# Patient Record
Sex: Male | Born: 1940
Health system: Southern US, Community
[De-identification: ages and names within clinical notes are randomized; demographics above are authoritative.]

## PROBLEM LIST (undated history)

## (undated) DIAGNOSIS — Q211 Atrial septal defect: Secondary | ICD-10-CM

## (undated) DIAGNOSIS — I341 Nonrheumatic mitral (valve) prolapse: Secondary | ICD-10-CM

## (undated) DIAGNOSIS — I340 Nonrheumatic mitral (valve) insufficiency: Principal | ICD-10-CM

## (undated) DIAGNOSIS — Z8774 Personal history of (corrected) congenital malformations of heart and circulatory system: Secondary | ICD-10-CM

## (undated) DIAGNOSIS — I2699 Other pulmonary embolism without acute cor pulmonale: Secondary | ICD-10-CM

## (undated) HISTORY — DX: Nonrheumatic mitral (valve) insufficiency: I34.0

## (undated) HISTORY — PX: EYE SURGERY: SHX253

## (undated) HISTORY — PX: HERNIA REPAIR: SHX51

## (undated) HISTORY — DX: Nonrheumatic mitral (valve) prolapse: I34.1

## (undated) HISTORY — DX: Atrial septal defect: Q21.1

---

## 2005-04-04 DIAGNOSIS — I2699 Other pulmonary embolism without acute cor pulmonale: Secondary | ICD-10-CM

## 2005-04-04 HISTORY — DX: Other pulmonary embolism without acute cor pulmonale: I26.99

## 2005-08-12 ENCOUNTER — Encounter: Admission: RE | Admit: 2005-08-12 | Discharge: 2005-08-12 | Payer: Self-pay | Admitting: General Surgery

## 2006-02-20 ENCOUNTER — Ambulatory Visit: Payer: Self-pay | Admitting: Oncology

## 2006-02-20 ENCOUNTER — Encounter: Admission: RE | Admit: 2006-02-20 | Discharge: 2006-02-20 | Payer: Self-pay | Admitting: General Surgery

## 2006-02-21 ENCOUNTER — Ambulatory Visit: Payer: Self-pay | Admitting: Oncology

## 2006-02-21 ENCOUNTER — Encounter: Payer: Self-pay | Admitting: Vascular Surgery

## 2006-02-21 ENCOUNTER — Ambulatory Visit: Admission: RE | Admit: 2006-02-21 | Discharge: 2006-02-21 | Payer: Self-pay | Admitting: Oncology

## 2006-02-26 LAB — HYPERCOAGULABLE PANEL, COMPREHENSIVE
Anticardiolipin IgG: <7 [GPL'U] (ref <11)
Anticardiolipin IgM: <7 [MPL'U] (ref <10)
Beta-2 Glyco I IgG: 6 U/mL (ref <20)
Beta-2-Glycoprotein I IgA: <4 U/mL (ref <10)
Beta-2-Glycoprotein I IgM: <4 U/mL (ref <10)
DRVVT 1:1 Mix: 40.3 secs (ref 31.9–44.2)
DRVVT: 47.6 secs — ABNORMAL HIGH (ref 31.9–44.2)
PTTLA Confirmation: 14.4 secs — ABNORMAL HIGH (ref <8.0)
Protein S Activity: 107 % (ref 81–180)
Protein S Ag, Total: 120 % (ref 84–134)

## 2006-02-27 LAB — PROTIME-INR: Protime: 13.2 Seconds (ref 10.6–13.4)

## 2006-03-03 LAB — COMPREHENSIVE METABOLIC PANEL
AST: 35 U/L (ref 0–37)
Alkaline Phosphatase: 88 U/L (ref 39–117)
BUN: 15 mg/dL (ref 6–23)
Chloride: 103 mEq/L (ref 96–112)
Creatinine, Ser: 0.95 mg/dL (ref 0.40–1.50)
Glucose, Bld: 123 mg/dL — ABNORMAL HIGH (ref 70–99)
Potassium: 4 mEq/L (ref 3.5–5.3)
Sodium: 142 mEq/L (ref 135–145)
Total Bilirubin: 0.6 mg/dL (ref 0.3–1.2)

## 2006-03-03 LAB — PROTIME-INR: Protime: 22.8 Seconds — ABNORMAL HIGH (ref 10.6–13.4)

## 2006-03-07 LAB — PROTIME-INR
INR: 2.6 (ref 2.00–3.50)
Protime: 31.2 Seconds — ABNORMAL HIGH (ref 10.6–13.4)

## 2006-03-10 LAB — PROTIME-INR: Protime: 32.4 Seconds — ABNORMAL HIGH (ref 10.6–13.4)

## 2006-03-24 LAB — PROTIME-INR: INR: 3.4 (ref 2.00–3.50)

## 2006-03-27 LAB — PROTIME-INR
INR: 2.4 (ref 2.00–3.50)
Protime: 28.8 Seconds — ABNORMAL HIGH (ref 10.6–13.4)

## 2006-03-31 LAB — PROTIME-INR
INR: 3.7 — ABNORMAL HIGH (ref 2.00–3.50)
Protime: 44.4 Seconds — ABNORMAL HIGH (ref 10.6–13.4)

## 2006-04-03 LAB — CBC WITH DIFFERENTIAL/PLATELET
BASO%: 1.6 % (ref 0.0–2.0)
Basophils Absolute: 0.1 10*3/uL (ref 0.0–0.1)
Eosinophils Absolute: 0.4 10*3/uL (ref 0.0–0.5)
HGB: 16.2 g/dL (ref 13.0–17.1)
MCHC: 35.6 g/dL (ref 32.0–35.9)
MONO#: 0.5 10*3/uL (ref 0.1–0.9)
MONO%: 9.4 % (ref 0.0–13.0)
NEUT#: 2.9 10*3/uL (ref 1.5–6.5)
NEUT%: 50 % (ref 40.0–75.0)
RBC: 5.64 10*6/uL (ref 4.20–5.71)
RDW: 11.6 % (ref 11.2–14.6)
lymph#: 1.8 10*3/uL (ref 0.9–3.3)

## 2006-04-11 ENCOUNTER — Ambulatory Visit: Payer: Self-pay | Admitting: Oncology

## 2006-04-14 LAB — PROTIME-INR
INR: 3 (ref 2.00–3.50)
Protime: 36 Seconds — ABNORMAL HIGH (ref 10.6–13.4)

## 2006-04-24 ENCOUNTER — Ambulatory Visit: Payer: Self-pay | Admitting: Oncology

## 2006-05-12 LAB — PROTIME-INR
INR: 2.3 (ref 2.00–3.50)
Protime: 27.6 Seconds — ABNORMAL HIGH (ref 10.6–13.4)

## 2006-05-31 ENCOUNTER — Encounter: Admission: RE | Admit: 2006-05-31 | Discharge: 2006-05-31 | Payer: Self-pay | Admitting: Oncology

## 2006-06-02 LAB — CBC WITH DIFFERENTIAL/PLATELET
Basophils Absolute: 0.1 10*3/uL (ref 0.0–0.1)
EOS%: 4.4 % (ref 0.0–7.0)
MCH: 29 pg (ref 28.0–33.4)
MCHC: 35.4 g/dL (ref 32.0–35.9)
MCV: 81.9 fL (ref 81.6–98.0)
MONO#: 0.4 10*3/uL (ref 0.1–0.9)
NEUT#: 3.8 10*3/uL (ref 1.5–6.5)
RDW: 13.3 % (ref 11.2–14.6)
WBC: 6.2 10*3/uL (ref 4.0–10.0)
lymph#: 1.6 10*3/uL (ref 0.9–3.3)

## 2006-06-02 LAB — CHCC SMEAR

## 2006-06-02 LAB — FERRITIN: Ferritin: 195 ng/mL (ref 22–322)

## 2006-06-02 LAB — PROTIME-INR: INR: 3.4 (ref 2.00–3.50)

## 2006-06-20 ENCOUNTER — Ambulatory Visit: Payer: Self-pay | Admitting: Oncology

## 2006-06-23 LAB — PROTIME-INR
INR: 2.5 (ref 2.00–3.50)
Protime: 30 s — ABNORMAL HIGH (ref 10.6–13.4)

## 2006-07-28 LAB — PROTIME-INR: Protime: 31.2 Seconds — ABNORMAL HIGH (ref 10.6–13.4)

## 2006-08-25 ENCOUNTER — Ambulatory Visit: Payer: Self-pay | Admitting: Oncology

## 2006-08-29 LAB — PROTIME-INR
INR: 2.7 (ref 2.00–3.50)
Protime: 32.4 Seconds — ABNORMAL HIGH (ref 10.6–13.4)

## 2006-11-08 ENCOUNTER — Ambulatory Visit: Payer: Self-pay | Admitting: Oncology

## 2006-11-08 LAB — PROTIME-INR: Protime: 39.6 Seconds — ABNORMAL HIGH (ref 10.6–13.4)

## 2006-12-27 ENCOUNTER — Ambulatory Visit: Payer: Self-pay | Admitting: Oncology

## 2006-12-27 LAB — PROTIME-INR: Protime: 33.6 Seconds — ABNORMAL HIGH (ref 10.6–13.4)

## 2007-06-08 ENCOUNTER — Ambulatory Visit: Payer: Self-pay | Admitting: Oncology

## 2007-12-05 ENCOUNTER — Ambulatory Visit: Payer: Self-pay | Admitting: Oncology

## 2007-12-06 LAB — PROTIME-INR
INR: 2.6 (ref 2.00–3.50)
Protime: 31.2 Seconds — ABNORMAL HIGH (ref 10.6–13.4)

## 2013-06-05 ENCOUNTER — Inpatient Hospital Stay (HOSPITAL_COMMUNITY)
Admission: EM | Admit: 2013-06-05 | Discharge: 2013-06-07 | DRG: 444 | Disposition: A | Discharge status: Home / Self Care | Payer: Medicare Other | Attending: General Surgery | Admitting: General Surgery

## 2013-06-05 ENCOUNTER — Emergency Department (HOSPITAL_COMMUNITY): Payer: Medicare Other

## 2013-06-05 ENCOUNTER — Encounter (HOSPITAL_COMMUNITY): Payer: Self-pay | Admitting: Emergency Medicine

## 2013-06-05 DIAGNOSIS — K8050 Calculus of bile duct without cholangitis or cholecystitis without obstruction: Principal | ICD-10-CM | POA: Diagnosis present

## 2013-06-05 DIAGNOSIS — Z791 Long term (current) use of non-steroidal anti-inflammatories (NSAID): Secondary | ICD-10-CM

## 2013-06-05 DIAGNOSIS — K859 Acute pancreatitis without necrosis or infection, unspecified: Secondary | ICD-10-CM | POA: Diagnosis present

## 2013-06-05 DIAGNOSIS — Z86711 Personal history of pulmonary embolism: Secondary | ICD-10-CM

## 2013-06-05 HISTORY — DX: Other pulmonary embolism without acute cor pulmonale: I26.99

## 2013-06-05 LAB — CBC WITH DIFFERENTIAL/PLATELET
BASOS ABS: 0 10*3/uL (ref 0.0–0.1)
Basophils Relative: 0 % (ref 0–1)
Eosinophils Absolute: 0.1 10*3/uL (ref 0.0–0.7)
Eosinophils Relative: 1 % (ref 0–5)
HCT: 41.7 % (ref 39.0–52.0)
Hemoglobin: 15.2 g/dL (ref 13.0–17.0)
LYMPHS PCT: 2 % — AB (ref 12–46)
Lymphs Abs: 0.3 10*3/uL — ABNORMAL LOW (ref 0.7–4.0)
MCH: 30.2 pg (ref 26.0–34.0)
MCHC: 36.5 g/dL — ABNORMAL HIGH (ref 30.0–36.0)
MCV: 82.9 fL (ref 78.0–100.0)
Monocytes Absolute: 1.5 10*3/uL — ABNORMAL HIGH (ref 0.1–1.0)
Monocytes Relative: 11 % (ref 3–12)
NEUTROS ABS: 11.3 10*3/uL — AB (ref 1.7–7.7)
Neutrophils Relative %: 86 % — ABNORMAL HIGH (ref 43–77)
PLATELETS: 167 10*3/uL (ref 150–400)
RBC: 5.03 MIL/uL (ref 4.22–5.81)
RDW: 13.5 % (ref 11.5–15.5)
WBC: 13.1 10*3/uL — AB (ref 4.0–10.5)

## 2013-06-05 LAB — COMPREHENSIVE METABOLIC PANEL
ALT: 132 U/L — ABNORMAL HIGH (ref 0–53)
AST: 112 U/L — ABNORMAL HIGH (ref 0–37)
Albumin: 3.7 g/dL (ref 3.5–5.2)
Alkaline Phosphatase: 233 U/L — ABNORMAL HIGH (ref 39–117)
BUN: 19 mg/dL (ref 6–23)
CO2: 20 mEq/L (ref 19–32)
Calcium: 10 mg/dL (ref 8.4–10.5)
Chloride: 99 mEq/L (ref 96–112)
Creatinine, Ser: 0.74 mg/dL (ref 0.50–1.35)
GFR calc Af Amer: >90 mL/min (ref >90)
GFR calc non Af Amer: >90 mL/min (ref >90)
Glucose, Bld: 159 mg/dL — ABNORMAL HIGH (ref 70–99)
Potassium: 3.5 mEq/L — ABNORMAL LOW (ref 3.7–5.3)
Sodium: 138 mEq/L (ref 137–147)
Total Bilirubin: 8.7 mg/dL — ABNORMAL HIGH (ref 0.3–1.2)
Total Protein: 7.7 g/dL (ref 6.0–8.3)

## 2013-06-05 LAB — URINE MICROSCOPIC-ADD ON

## 2013-06-05 LAB — LIPASE, BLOOD: Lipase: 2003 U/L — ABNORMAL HIGH (ref 11–59)

## 2013-06-05 LAB — URINALYSIS, ROUTINE W REFLEX MICROSCOPIC
Glucose, UA: NEGATIVE mg/dL
Ketones, ur: 15 mg/dL — AB
Nitrite: POSITIVE — AB
Protein, ur: 100 mg/dL — AB
Specific Gravity, Urine: 1.03 (ref 1.005–1.030)
Urobilinogen, UA: >8 mg/dL — ABNORMAL HIGH (ref 0.0–1.0)
pH: 5 (ref 5.0–8.0)

## 2013-06-05 LAB — I-STAT TROPONIN, ED: Troponin i, poc: 0.06 ng/mL (ref 0.00–0.08)

## 2013-06-05 MED ORDER — PANTOPRAZOLE SODIUM 40 MG IV SOLR
40.0000 mg | Freq: Every day | INTRAVENOUS | Status: DC
  Administered 2013-06-05: 40 mg via INTRAVENOUS
  Filled 2013-06-05 (×2): qty 40

## 2013-06-05 MED ORDER — ONDANSETRON HCL 4 MG/2ML IJ SOLN
4.0000 mg | Freq: Once | INTRAMUSCULAR | Status: AC
  Administered 2013-06-05: 4 mg via INTRAVENOUS
  Filled 2013-06-05: qty 2

## 2013-06-05 MED ORDER — SODIUM CHLORIDE 0.9 % IV SOLN
1000.0000 mL | Freq: Once | INTRAVENOUS | Status: AC
Start: 2013-06-05 — End: 2013-06-05
  Administered 2013-06-05: 1000 mL via INTRAVENOUS

## 2013-06-05 MED ORDER — HEPARIN SODIUM (PORCINE) 5000 UNIT/ML IJ SOLN
5000.0000 [IU] | Freq: Three times a day (TID) | INTRAMUSCULAR | Status: DC
  Administered 2013-06-05 – 2013-06-07 (×5): 5000 [IU] via SUBCUTANEOUS
  Filled 2013-06-05 (×8): qty 1

## 2013-06-05 MED ORDER — ONDANSETRON HCL 4 MG/2ML IJ SOLN: 4.0000 mg | Freq: Four times a day (QID) | INTRAMUSCULAR | Status: DC | PRN

## 2013-06-05 MED ORDER — MORPHINE SULFATE 2 MG/ML IJ SOLN: 2.0000 mg | INTRAMUSCULAR | Status: DC | PRN

## 2013-06-05 MED ORDER — SODIUM CHLORIDE 0.9 % IV SOLN
1000.0000 mL | INTRAVENOUS | Status: DC
  Administered 2013-06-05: 1000 mL via INTRAVENOUS

## 2013-06-05 MED ORDER — KCL IN DEXTROSE-NACL 20-5-0.9 MEQ/L-%-% IV SOLN
INTRAVENOUS | Status: DC
  Administered 2013-06-05 – 2013-06-07 (×4): via INTRAVENOUS
  Filled 2013-06-05 (×6): qty 1000

## 2013-06-05 MED ORDER — IOHEXOL 300 MG/ML  SOLN
20.0000 mL | Freq: Once | INTRAMUSCULAR | Status: AC | PRN
  Administered 2013-06-05: 20 mL via ORAL

## 2013-06-05 MED ORDER — IOHEXOL 300 MG/ML  SOLN
100.0000 mL | Freq: Once | INTRAMUSCULAR | Status: AC | PRN
  Administered 2013-06-05: 100 mL via INTRAVENOUS

## 2013-06-05 NOTE — ED Notes (Signed)
Pt states he has been having epigastric pain and bloating feeling for 3 days. Also reports vomiting since last night only a couple times. Denies any lower abd pain or upper chest pain.

## 2013-06-05 NOTE — ED Provider Notes (Signed)
CSN: [id number]     Arrival date & time 06/05/13  1703 History   First MD Initiated Contact with Patient 06/05/13 1745     Chief Complaint  Patient presents with  . Abdominal Pain  . Emesis      HPI Is reports upper abdominal discomfort as well as some associated nausea vomiting over the past several days.  He denies fevers and chills.  He denies back pain or flank pain.  He has no dysuria or urinary frequency.  He reports his urine has been concentrated in color.  His had decreased oral intake.  He has no prior history of abdominal surgery.  No prior history of gallstones.  He's never had discomfort or pain like this before.  No chest pain or shortness of breath.  His pain is mild to moderate in severity at this time.  He states his pain next he feels to be improved.  His main complaint at this time is nausea  Past Medical History  Diagnosis Date  . Pulmonary embolism    Past Surgical History  Procedure Laterality Date  . Hernia repair     No family history on file. History  Substance Use Topics  . Smoking status: Current Every Day Smoker  . Smokeless tobacco: Not on file  . Alcohol Use: Yes     Comment: occasionally    Review of Systems  All other systems reviewed and are negative.      Allergies  Primaxin  Home Medications   Current Outpatient Rx  Name  Route  Sig  Dispense  Refill  . ibuprofen (ADVIL,MOTRIN) 200 MG tablet   Oral   Take 400 mg by mouth every 6 (six) hours as needed for moderate pain.          BP 110/62  Pulse 78  Temp(Src) 99.6 F (37.6 C) (Oral)  Resp 16  Ht 5\' 9"  (1.753 m)  Wt 175 lb (79.379 kg)  BMI 25.83 kg/m2  SpO2 94% Physical Exam  Nursing note and vitals reviewed. Constitutional: He is oriented to person, place, and time. He appears well-developed and well-nourished.  HENT:  Head: Normocephalic and atraumatic.  Eyes: EOM are normal.  Neck: Normal range of motion.  Cardiovascular: Normal rate, regular rhythm, normal heart  sounds and intact distal pulses.   Pulmonary/Chest: Effort normal and breath sounds normal. No respiratory distress.  Abdominal: Soft. He exhibits no distension. There is no tenderness.  Musculoskeletal: Normal range of motion.  Neurological: He is alert and oriented to person, place, and time.  Skin: Skin is warm and dry.  Psychiatric: He has a normal mood and affect. Judgment normal.    ED Course  Procedures (including critical care time) Labs Review Labs Reviewed  CBC WITH DIFFERENTIAL - Abnormal; Notable for the following:    WBC 13.1 (*)    MCHC 36.5 (*)    Neutrophils Relative % 86 (*)    Neutro Abs 11.3 (*)    Lymphocytes Relative 2 (*)    Lymphs Abs 0.3 (*)    Monocytes Absolute 1.5 (*)    All other components within normal limits  COMPREHENSIVE METABOLIC PANEL - Abnormal; Notable for the following:    Potassium 3.5 (*)    Glucose, Bld 159 (*)    AST 112 (*)    ALT 132 (*)    Alkaline Phosphatase 233 (*)    Total Bilirubin 8.7 (*)    All other components within normal limits  LIPASE, BLOOD - Abnormal; Notable  for the following:    Lipase 2003 (*)    All other components within normal limits  URINALYSIS, ROUTINE W REFLEX MICROSCOPIC - Abnormal; Notable for the following:    Color, Urine ORANGE (*)    APPearance HAZY (*)    Hgb urine dipstick TRACE (*)    Bilirubin Urine LARGE (*)    Ketones, ur 15 (*)    Protein, ur 100 (*)    Urobilinogen, UA >8.0 (*)    Nitrite POSITIVE (*)    Leukocytes, UA SMALL (*)    All other components within normal limits  URINE MICROSCOPIC-ADD ON - Abnormal; Notable for the following:    Squamous Epithelial / LPF FEW (*)    Bacteria, UA FEW (*)    Casts GRANULAR CAST (*)    All other components within normal limits  I-STAT TROPOININ, ED   Imaging Review Ct Abdomen Pelvis W Contrast  06/05/2013   CLINICAL DATA:  Epigastric pain with bloating for 3 days. Vomiting.  EXAM: CT ABDOMEN AND PELVIS WITH CONTRAST  TECHNIQUE: Multidetector  CT imaging of the abdomen and pelvis was performed using the standard protocol following bolus administration of intravenous contrast.  CONTRAST:  100mL OMNIPAQUE IOHEXOL 300 MG/ML  SOLN  COMPARISON:  None.  FINDINGS: Lung Bases: Motion artifact present at the lung bases. Dependent atelectasis.  Liver: 14 mm intermediate density lesion present in segment 4A of the liver, nonspecific. Small amount of intrahepatic biliary ductal dilation. Tiny amount perihepatic ascites.  Spleen:  Normal.  Gallbladder:  Distended.  No calcified stones identified.  Common bile duct: Dilated to 14 mm. No calcified stones identified in the common bile duct.  Pancreas: Peripancreatic phlegmon is present suggesting acute pancreatitis. No fluid collections are identified. The uncinate process of pancreas appears edematous.  Adrenal glands:  Normal.  Kidneys: Normal enhancement. Left inferior pole simple renal cyst measuring 28 mm. Normal delayed excretion of contrast. The ureters appear within normal limits.  Stomach:  Small hiatal hernia.  Small bowel: Duodenum appears normal. No bowel obstruction or bowel inflammatory changes.  Colon: Normal appendix. Distal colon decompressed. No colonic inflammatory changes.  Pelvic Genitourinary: Urinary bladder appears normal. Mild prostatic enlargement with 48 mm transverse band of the prostate gland. No free fluid.  Bones: Severe lumbar spondylosis. No aggressive osseous lesions. Likely degenerative lumbar scoliosis. Sclerotic lesion in the parasymphyseal right pubis likely represents a benign bone island. Consider correlation with PSA in this old male.  Vasculature: No acute abnormality.  Atherosclerosis.  Body Wall: Fat containing right inguinal hernia. Fluid is present in the hernia sac.  IMPRESSION: 1. Peripancreatic phlegmon, peripancreatic phlegmon and swelling and edema of the uncinate process of the pancreas. Dilated common bile duct. Findings suggest gallstone pancreatitis. Correlation with  amylase and lipase recommended. 2. Left inferior pole simple renal cyst. 3. Indeterminate intermediate attenuation 14 mm lesion in segment 4A of the liver. 4. Small sclerotic lesion in the right parasymphyseal pubis. Consider correlation with PSA.   Electronically Signed   By: Andreas NewportGeoffrey  Lamke M.D.   On: 06/05/2013 19:53  I personally reviewed the imaging tests through PACS system I reviewed available ER/hospitalization records through the EMR    EKG Interpretation   Date/Time:  Wednesday June 05 2013 17:26:01 EST Ventricular Rate:  105 PR Interval:  164 QRS Duration: 82 QT Interval:  322 QTC Calculation: 425 R Axis:   67 Text Interpretation:  Sinus tachycardia with frequent Premature  ventricular complexes Nonspecific ST and T wave abnormality Abnormal ECG  No old tracing to compare Confirmed by Kattleya Kuhnert  MD, Caryn Bee ([id number]) on  06/05/2013 6:51:25 PM      MDM   Final diagnoses:  Pancreatitis    Likely gallstone pancreatitis.  The patient is feeling much better at this time.  No peritonitis on exam.  Patient will be admitted to the general surgery service.  Likely ultrasound morning.  Surgery will follow and trend his labs.  Transient hypotension emergency department. Responded to fluids,.  Likely related to volume depletion  CCS: Dr Michaela Corner, MD 06/05/13 2026

## 2013-06-05 NOTE — H&P (Signed)
Jeremy Fisher is an 73 y.o. male.   Chief Complaint: abdominal pain, vomiting  HPI: Patient was in his usual state of good health until 3 days ago when after dinner he developed fairly rapid onset of epigastric abdominal pain. He describes aching and cramping pain that came in waves, waxing and waning. The pain continued and the following day, 2 days ago he developed nausea and vomiting. Vomiting has been nonbilious. The symptoms continued to into today with continued epigastric abdominal pain and vomiting. He has noticed very dark urine. He called and I asked him to come to the emergency room for evaluation. A couple of months ago he had an episode of mild abdominal pain and nausea that resolved after a day or 2. He does not drink alcohol. Otherwise he has no chronic or ongoing GI complaints. He had normal bowel movements the last 2 days. No melena or hematochezia. He has felt chills but no fever.  Past Medical History  Diagnosis Date  . Pulmonary embolism     Past Surgical History  Procedure Laterality Date  . Hernia repair      No family history on file. Social History:  Rare alcohol and none recently. Does not smoke  Allergies:  Allergies  Allergen Reactions  . Primaxin [Imipenem] Other (See Comments)    seizure   Current Facility-Administered Medications  Medication Dose Route Frequency Provider Last Rate Last Dose  . 0.9 %  sodium chloride infusion  1,000 mL Intravenous Continuous Hoy Morn, MD 125 mL/hr at 06/05/13 1900 1,000 mL at 06/05/13 1900   Current Outpatient Prescriptions  Medication Sig Dispense Refill  . ibuprofen (ADVIL,MOTRIN) 200 MG tablet Take 400 mg by mouth every 6 (six) hours as needed for moderate pain.         Results for orders placed during the hospital encounter of 06/05/13 (from the past 48 hour(s))  CBC WITH DIFFERENTIAL     Status: Abnormal   Collection Time    06/05/13  5:26 PM      Result Value Ref Range   WBC 13.1 (*) 4.0 - 10.5  K/uL   RBC 5.03  4.22 - 5.81 MIL/uL   Hemoglobin 15.2  13.0 - 17.0 g/dL   HCT 41.7  39.0 - 52.0 %   MCV 82.9  78.0 - 100.0 fL   MCH 30.2  26.0 - 34.0 pg   MCHC 36.5 (*) 30.0 - 36.0 g/dL   RDW 13.5  11.5 - 15.5 %   Platelets 167  150 - 400 K/uL   Neutrophils Relative % 86 (*) 43 - 77 %   Neutro Abs 11.3 (*) 1.7 - 7.7 K/uL   Lymphocytes Relative 2 (*) 12 - 46 %   Lymphs Abs 0.3 (*) 0.7 - 4.0 K/uL   Monocytes Relative 11  3 - 12 %   Monocytes Absolute 1.5 (*) 0.1 - 1.0 K/uL   Eosinophils Relative 1  0 - 5 %   Eosinophils Absolute 0.1  0.0 - 0.7 K/uL   Basophils Relative 0  0 - 1 %   Basophils Absolute 0.0  0.0 - 0.1 K/uL  COMPREHENSIVE METABOLIC PANEL     Status: Abnormal   Collection Time    06/05/13  5:26 PM      Result Value Ref Range   Sodium 138  137 - 147 mEq/L   Potassium 3.5 (*) 3.7 - 5.3 mEq/L   Chloride 99  96 - 112 mEq/L   CO2 20  19 -  32 mEq/L   Glucose, Bld 159 (*) 70 - 99 mg/dL   BUN 19  6 - 23 mg/dL   Creatinine, Ser 0.74  0.50 - 1.35 mg/dL   Calcium 10.0  8.4 - 10.5 mg/dL   Total Protein 7.7  6.0 - 8.3 g/dL   Albumin 3.7  3.5 - 5.2 g/dL   AST 112 (*) 0 - 37 U/L   ALT 132 (*) 0 - 53 U/L   Alkaline Phosphatase 233 (*) 39 - 117 U/L   Total Bilirubin 8.7 (*) 0.3 - 1.2 mg/dL   GFR calc non Af Amer >90  >90 mL/min   GFR calc Af Amer >90  >90 mL/min   Comment: (NOTE)     The eGFR has been calculated using the CKD EPI equation.     This calculation has not been validated in all clinical situations.     eGFR's persistently <90 mL/min signify possible Chronic Kidney     Disease.  LIPASE, BLOOD     Status: Abnormal   Collection Time    06/05/13  5:26 PM      Result Value Ref Range   Lipase 2003 (*) 11 - 59 U/L  I-STAT TROPOININ, ED     Status: None   Collection Time    06/05/13  6:12 PM      Result Value Ref Range   Troponin i, poc 0.06  0.00 - 0.08 ng/mL   Comment 3            Comment: Due to the release kinetics of cTnI,     a negative result within the  first hours     of the onset of symptoms does not rule out     myocardial infarction with certainty.     If myocardial infarction is still suspected,     repeat the test at appropriate intervals.   No results found.  Review of Systems  Constitutional: Positive for chills and malaise/fatigue. Negative for fever.  HENT: Negative.   Respiratory: Negative.   Cardiovascular: Negative.   Gastrointestinal: Positive for nausea, vomiting and abdominal pain. Negative for diarrhea, constipation, blood in stool and melena.  Genitourinary: Positive for hematuria and flank pain.       Dark urine  Musculoskeletal: Negative.     Blood pressure 106/38, pulse 97, temperature 99.6 F (37.6 C), temperature source Oral, resp. rate 16, height '5\' 9"'  (1.753 m), weight 175 lb (79.379 kg), SpO2 94.00%. Physical Exam  General: Alert, well-developed Caucasian male, in no distress Skin: Warm and dry without rash or infection. Probable jaundice. HEENT:  Sclera appear icteric. Pupils equal round and reactive.  Lungs: Breath sounds clear and equal without increased work of breathing Cardiovascular:mild tachycardia without murmur. No JVD or edema.  Abdomen: Nondistended.bowel sounds are present. There is mild epigastric tenderness without guarding or palpable mass. No organomegaly. There is a very tiny nontender reducible umbilical hernia. Extremities: No edema or joint swelling or deformity. No chronic venous stasis changes. Neurologic: Alert and fully oriented.   Assessment/Plan Acute abdominal pain and vomiting with lab work showing acute pancreatitis and elevated LFTs. He is a non-drinker and most likely this represents gallstone pancreatitis. We will proceed with CT scan of the abdomen and pelvis and ultrasound of the gallbladder and he will be admitted for treatment with IV fluids, pain medication and antiemetics.  Sarah Zerby T 06/05/2013, 7:17 PM

## 2013-06-06 ENCOUNTER — Encounter (HOSPITAL_COMMUNITY): Payer: Self-pay | Admitting: General Practice

## 2013-06-06 ENCOUNTER — Inpatient Hospital Stay (HOSPITAL_COMMUNITY): Payer: Medicare Other

## 2013-06-06 LAB — COMPREHENSIVE METABOLIC PANEL
ALBUMIN: 2.9 g/dL — AB (ref 3.5–5.2)
ALT: 87 U/L — AB (ref 0–53)
AST: 68 U/L — ABNORMAL HIGH (ref 0–37)
Alkaline Phosphatase: 178 U/L — ABNORMAL HIGH (ref 39–117)
BUN: 15 mg/dL (ref 6–23)
CO2: 18 mEq/L — ABNORMAL LOW (ref 19–32)
Calcium: 9 mg/dL (ref 8.4–10.5)
Chloride: 105 mEq/L (ref 96–112)
Creatinine, Ser: 0.7 mg/dL (ref 0.50–1.35)
GFR calc non Af Amer: >90 mL/min (ref >90)
Glucose, Bld: 133 mg/dL — ABNORMAL HIGH (ref 70–99)
POTASSIUM: 4.3 meq/L (ref 3.7–5.3)
SODIUM: 138 meq/L (ref 137–147)
TOTAL PROTEIN: 6.3 g/dL (ref 6.0–8.3)
Total Bilirubin: 6.6 mg/dL — ABNORMAL HIGH (ref 0.3–1.2)

## 2013-06-06 LAB — CBC
HEMATOCRIT: 36.7 % — AB (ref 39.0–52.0)
Hemoglobin: 12.7 g/dL — ABNORMAL LOW (ref 13.0–17.0)
MCH: 29.2 pg (ref 26.0–34.0)
MCHC: 34.6 g/dL (ref 30.0–36.0)
MCV: 84.4 fL (ref 78.0–100.0)
Platelets: 145 10*3/uL — ABNORMAL LOW (ref 150–400)
RBC: 4.35 MIL/uL (ref 4.22–5.81)
RDW: 13.6 % (ref 11.5–15.5)
WBC: 9.3 10*3/uL (ref 4.0–10.5)

## 2013-06-06 LAB — LIPASE, BLOOD: Lipase: 1573 U/L — ABNORMAL HIGH (ref 11–59)

## 2013-06-06 MED ORDER — PANTOPRAZOLE SODIUM 40 MG PO TBEC
40.0000 mg | DELAYED_RELEASE_TABLET | Freq: Every day | ORAL | Status: DC
  Administered 2013-06-06: 40 mg via ORAL

## 2013-06-06 MED ORDER — HYDROCODONE-ACETAMINOPHEN 5-325 MG PO TABS: 1.0000 | ORAL_TABLET | ORAL | Status: DC | PRN

## 2013-06-06 NOTE — Progress Notes (Signed)
Patient ID: Jeremy Fisher, male   DOB: 1940/05/14, 73 y.o.   MRN: [id number]    Subjective: Feels much better today. Pain is resolved. No nausea.  Objective: Vital signs in last 24 hours: Temp:  [98 F (36.7 C)-99.6 F (37.6 C)] 98 F (36.7 C) (03/05 0558) Pulse Rate:  [66-108] 66 (03/05 0558) Resp:  [16-20] 20 (03/05 0558) BP: (101-148)/(38-76) 108/54 mmHg (03/05 0558) SpO2:  [94 %-96 %] 94 % (03/05 0558) Weight:  [170 lb 3.1 oz (77.2 kg)-175 lb (79.379 kg)] 170 lb 3.1 oz (77.2 kg) (03/04 2036) Last BM Date: 06/05/13  Intake/Output from previous day:   Intake/Output this shift:    General appearance: alert, cooperative and no distress GI: normal findings: soft, non-tender and nondistended  Lab Results:   Recent Labs  06/05/13 1726 06/06/13 0553  WBC 13.1* 9.3  HGB 15.2 12.7*  HCT 41.7 36.7*  PLT 167 145*   BMET  Recent Labs  06/05/13 1726 06/06/13 0553  NA 138 138  K 3.5* 4.3  CL 99 105  CO2 20 18*  GLUCOSE 159* 133*  BUN 19 15  CREATININE 0.74 0.70  CALCIUM 10.0 9.0     Studies/Results: US Abdomen Complete  06/06/2013   CLINICAL DATA:  Evaluate for gallstones, acute pancreatitis  EXAM: ULTRASOUND ABDOMEN COMPLETE  COMPARISON:  None.  FINDINGS: Gallbladder:  No gallstones, or sludge. The gallbladder wall is thickened and demonstrates areas of edema measuring 4.7 mm. No sonographic Murphy sign noted.  Common bile duct:  Diameter: 5.4 mm at the head of the pancreas  Liver:  No focal lesion identified. Within normal limits in parenchymal echogenicity.  IVC:  No abnormality visualized.  Pancreas:  Visualized portion unremarkable.  Spleen:  Size and appearance within normal limits.  Right Kidney:  Length: 10.9 cm. Echogenicity within normal limits. No mass or hydronephrosis visualized.  Left Kidney:  Length: 12.4 cm. Echogenicity within normal limits. No mass or hydronephrosis visualized.  Abdominal aorta:  No aneurysm visualized.  Other findings:  None.   IMPRESSION: There is gallbladder wall thickening and edema likely reactive considering patient's history of pancreatitis. Otherwise no evidence of cholecystitis. There is no evidence of gallstones nor sonographically appreciable sludge within the gallbladder.   Electronically Signed   By: Salome Holmes M.D.   On: 06/06/2013 08:47   Ct Abdomen Pelvis W Contrast  06/05/2013   CLINICAL DATA:  Epigastric pain with bloating for 3 days. Vomiting.  EXAM: CT ABDOMEN AND PELVIS WITH CONTRAST  TECHNIQUE: Multidetector CT imaging of the abdomen and pelvis was performed using the standard protocol following bolus administration of intravenous contrast.  CONTRAST:  OMNIPAQUE IOHEXOL 300 MG/ML  SOLN  COMPARISON:  None.  FINDINGS: Lung Bases: Motion artifact present at the lung bases. Dependent atelectasis.  Liver: 14 mm intermediate density lesion present in segment 4A of the liver, nonspecific. Small amount of intrahepatic biliary ductal dilation. Tiny amount perihepatic ascites.  Spleen:  Normal.  Gallbladder:  Distended.  No calcified stones identified.  Common bile duct: Dilated to 14 mm. No calcified stones identified in the common bile duct.  Pancreas: Peripancreatic phlegmon is present suggesting acute pancreatitis. No fluid collections are identified. The uncinate process of pancreas appears edematous.  Adrenal glands:  Normal.  Kidneys: Normal enhancement. Left inferior pole simple renal cyst measuring 28 mm. Normal delayed excretion of contrast. The ureters appear within normal limits.  Stomach:  Small hiatal hernia.  Small bowel: Duodenum appears normal. No bowel obstruction or  bowel inflammatory changes.  Colon: Normal appendix. Distal colon decompressed. No colonic inflammatory changes.  Pelvic Genitourinary: Urinary bladder appears normal. Mild prostatic enlargement with 48 mm transverse band of the prostate gland. No free fluid.  Bones: Severe lumbar spondylosis. No aggressive osseous lesions. Likely  degenerative lumbar scoliosis. Sclerotic lesion in the parasymphyseal right pubis likely represents a benign bone island. Consider correlation with PSA in this old male.  Vasculature: No acute abnormality.  Atherosclerosis.  Body Wall: Fat containing right inguinal hernia. Fluid is present in the hernia sac.  IMPRESSION: 1. Peripancreatic phlegmon, peripancreatic phlegmon and swelling and edema of the uncinate process of the pancreas. Dilated common bile duct. Findings suggest gallstone pancreatitis. Correlation with amylase and lipase recommended. 2. Left inferior pole simple renal cyst. 3. Indeterminate intermediate attenuation 14 mm lesion in segment 4A of the liver. 4. Small sclerotic lesion in the right parasymphyseal pubis. Consider correlation with PSA.   Electronically Signed   By: Andreas NewportGeoffrey  Lamke M.D.   On: 06/05/2013 19:53    Anti-infectives: Anti-infectives   None      Assessment/Plan: Acute pancreatitis and elevated bilirubin and LFTs, clinically all improving. Ultrasound does not show gallstones but this is his second acute episode of similar pain which appears to be resolving quickly. The common bile duct was dilated on CT scan but is not today on ultrasound. This all seems extremely suspicious for a passed common bile duct stone despite the negative ultrasound. He has no other reason for acute pancreatitis and nothing clinically suggests neoplasm. We'll observe today and start clear liquid diet. Repeat lab tomorrow. If this resolves I believe the best course would be cholecystectomy with cholangiogram.    LOS: 1 day    [name] T 06/06/2013

## 2013-06-07 ENCOUNTER — Other Ambulatory Visit (INDEPENDENT_AMBULATORY_CARE_PROVIDER_SITE_OTHER): Payer: Self-pay | Admitting: General Surgery

## 2013-06-07 LAB — CBC
HCT: 36.8 % — ABNORMAL LOW (ref 39.0–52.0)
HEMOGLOBIN: 12.8 g/dL — AB (ref 13.0–17.0)
MCH: 29.6 pg (ref 26.0–34.0)
MCHC: 34.8 g/dL (ref 30.0–36.0)
MCV: 85 fL (ref 78.0–100.0)
Platelets: 158 10*3/uL (ref 150–400)
RBC: 4.33 MIL/uL (ref 4.22–5.81)
RDW: 13.7 % (ref 11.5–15.5)
WBC: 9 10*3/uL (ref 4.0–10.5)

## 2013-06-07 LAB — COMPREHENSIVE METABOLIC PANEL
ALT: 55 U/L — ABNORMAL HIGH (ref 0–53)
AST: 29 U/L (ref 0–37)
Albumin: 2.9 g/dL — ABNORMAL LOW (ref 3.5–5.2)
Alkaline Phosphatase: 162 U/L — ABNORMAL HIGH (ref 39–117)
BILIRUBIN TOTAL: 4.7 mg/dL — AB (ref 0.3–1.2)
BUN: 9 mg/dL (ref 6–23)
CALCIUM: 9.1 mg/dL (ref 8.4–10.5)
CHLORIDE: 104 meq/L (ref 96–112)
CO2: 22 meq/L (ref 19–32)
Creatinine, Ser: 0.72 mg/dL (ref 0.50–1.35)
GLUCOSE: 113 mg/dL — AB (ref 70–99)
Potassium: 3.7 mEq/L (ref 3.7–5.3)
Sodium: 138 mEq/L (ref 137–147)
Total Protein: 6.3 g/dL (ref 6.0–8.3)

## 2013-06-07 LAB — LIPASE, BLOOD: LIPASE: 736 U/L — AB (ref 11–59)

## 2013-06-07 MED ORDER — HYDROCODONE-ACETAMINOPHEN 5-325 MG PO TABS: 1.0000 | ORAL_TABLET | ORAL | Status: DC | PRN

## 2013-06-07 NOTE — Discharge Summary (Signed)
Patient ID: Jeremy Fisher MRN: [id number] DOB/AGE: 11-23-1940 73 y.o.  Admit date: 06/05/2013 Discharge date: 06/07/2013  Procedures: none  Consults: None  Reason for Admission: Patient was in his usual state of good health until 3 days ago when after dinner he developed fairly rapid onset of epigastric abdominal pain. He describes aching and cramping pain that came in waves, waxing and waning. The pain continued and the following day, 2 days ago he developed nausea and vomiting. Vomiting has been nonbilious. The symptoms continued to into today with continued epigastric abdominal pain and vomiting. He has noticed very dark urine. He called and I asked him to come to the emergency room for evaluation. A couple of months ago he had an episode of mild abdominal pain and nausea that resolved after a day or 2. He does not drink alcohol. Otherwise he has no chronic or ongoing GI complaints. He had normal bowel movements the last 2 days. No melena or hematochezia. He has felt chills but no fever.  Admission Diagnoses:  1. Acute abdominal pain 2. Pancreatitis 3. H/o PE  Hospital Course: The patient was admitted and a CT scan was obtained which revealed a dilated CBD with findings c/w pancreatitis and some inflammation of the uncinate process.  No gallstones were seen.  An [place] was obtained which didn't reveal any stones either, but his CBD was no longer dilated.  His was mad NPO.  The following day his pain had resolved and his LFTS and lipase were trending down.  He continued to do well.  It is felt that this is related to gallstone pancreatitis from a passed gallstone as he has no other factors for pancreatitis.  He has continued to improve and his diet was able to be advanced to clears and his LFTs are trending down.  He is stable for dc home on HD 2.  He will be set up for elective cholecystectomy as an outpatient.  Pe: Abd: soft, NT, ND, +BS  Discharge Diagnoses:  Active Problems:  Pancreatitis, acute likely secondary to gallstones  Discharge Medications:   Medication List         HYDROcodone-acetaminophen 5-325 MG per tablet  Commonly known as:  NORCO/VICODIN  Take 1-2 tablets by mouth every 4 (four) hours as needed for moderate pain.     ibuprofen 200 MG tablet  Commonly known as:  ADVIL,MOTRIN  Take 400 mg by mouth every 6 (six) hours as needed for moderate pain.        Discharge Instructions:     Follow-up Information   Follow up with HOXWORTH,BENJAMIN T, MD In 1 week. (for surgery)    Specialty:  General Surgery   Contact information:   [address] [place] [address] [phone]       Signed: Letha Cape 06/07/2013, 11:10 AM

## 2013-06-07 NOTE — Discharge Summary (Signed)
Patient interviewed and examined, agree with PA note above.  Mariella Saa MD, FACS  06/07/2013 1:38 PM

## 2013-06-07 NOTE — Discharge Instructions (Signed)
Fat and Cholesterol Control Diet  Fat and cholesterol levels in your blood and organs are influenced by your diet. High levels of fat and cholesterol may lead to diseases of the heart, small and large blood vessels, gallbladder, liver, and pancreas.  CONTROLLING FAT AND CHOLESTEROL WITH DIET  Although exercise and lifestyle factors are important, your diet is key. That is because certain foods are known to raise cholesterol and others to lower it. The goal is to balance foods for their effect on cholesterol and more importantly, to replace saturated and trans fat with other types of fat, such as monounsaturated fat, polyunsaturated fat, and omega-3 fatty acids.  On average, a person should consume no more than 15 to 17 g of saturated fat daily. Saturated and trans fats are considered "bad" fats, and they will raise LDL cholesterol. Saturated fats are primarily found in animal products such as meats, butter, and cream. However, that does not mean you need to give up all your favorite foods. Today, there are good tasting, low-fat, low-cholesterol substitutes for most of the things you like to eat. Choose low-fat or nonfat alternatives. Choose round or loin cuts of red meat. These types of cuts are lowest in fat and cholesterol. Chicken (without the skin), fish, veal, and ground turkey breast are great choices. Eliminate fatty meats, such as hot dogs and salami. Even shellfish have little or no saturated fat. Have a 3 oz (85 g) portion when you eat lean meat, poultry, or fish.  Trans fats are also called "partially hydrogenated oils." They are oils that have been scientifically manipulated so that they are solid at room temperature resulting in a longer shelf life and improved taste and texture of foods in which they are added. Trans fats are found in stick margarine, some tub margarines, cookies, crackers, and baked goods.   When baking and cooking, oils are a great substitute for butter. The monounsaturated oils are  especially beneficial since it is believed they lower LDL and raise HDL. The oils you should avoid entirely are saturated tropical oils, such as coconut and palm.   Remember to eat a lot from food groups that are naturally free of saturated and trans fat, including fish, fruit, vegetables, beans, grains (barley, rice, couscous, bulgur wheat), and pasta (without cream sauces).   IDENTIFYING FOODS THAT LOWER FAT AND CHOLESTEROL   Soluble fiber may lower your cholesterol. This type of fiber is found in fruits such as apples, vegetables such as broccoli, potatoes, and carrots, legumes such as beans, peas, and lentils, and grains such as barley. Foods fortified with plant sterols (phytosterol) may also lower cholesterol. You should eat at least 2 g per day of these foods for a cholesterol lowering effect.   Read package labels to identify low-saturated fats, trans fat free, and low-fat foods at the supermarket. Select cheeses that have only 2 to 3 g saturated fat per ounce. Use a heart-healthy tub margarine that is free of trans fats or partially hydrogenated oil. When buying baked goods (cookies, crackers), avoid partially hydrogenated oils. Breads and muffins should be made from whole grains (whole-wheat or whole oat flour, instead of "flour" or "enriched flour"). Buy non-creamy canned soups with reduced salt and no added fats.   FOOD PREPARATION TECHNIQUES   Never deep-fry. If you must fry, either stir-fry, which uses very little fat, or use non-stick cooking sprays. When possible, broil, bake, or roast meats, and steam vegetables. Instead of putting butter or margarine on vegetables, use lemon   and herbs, applesauce, and cinnamon (for squash and sweet potatoes). Use nonfat yogurt, salsa, and low-fat dressings for salads.   LOW-SATURATED FAT / LOW-FAT FOOD SUBSTITUTES  Meats / Saturated Fat (g)  · Avoid: Steak, marbled (3 oz/85 g) / 11 g  · Choose: Steak, lean (3 oz/85 g) / 4 g  · Avoid: Hamburger (3 oz/85 g) / 7  g  · Choose: Hamburger, lean (3 oz/85 g) / 5 g  · Avoid: Ham (3 oz/85 g) / 6 g  · Choose: Ham, lean cut (3 oz/85 g) / 2.4 g  · Avoid: Chicken, with skin, dark meat (3 oz/85 g) / 4 g  · Choose: Chicken, skin removed, dark meat (3 oz/85 g) / 2 g  · Avoid: Chicken, with skin, light meat (3 oz/85 g) / 2.5 g  · Choose: Chicken, skin removed, light meat (3 oz/85 g) / 1 g  Dairy / Saturated Fat (g)  · Avoid: Whole milk (1 cup) / 5 g  · Choose: Low-fat milk, 2% (1 cup) / 3 g  · Choose: Low-fat milk, 1% (1 cup) / 1.5 g  · Choose: Skim milk (1 cup) / 0.3 g  · Avoid: Hard cheese (1 oz/28 g) / 6 g  · Choose: Skim milk cheese (1 oz/28 g) / 2 to 3 g  · Avoid: Cottage cheese, 4% fat (1 cup) / 6.5 g  · Choose: Low-fat cottage cheese, 1% fat (1 cup) / 1.5 g  · Avoid: Ice cream (1 cup) / 9 g  · Choose: Sherbet (1 cup) / 2.5 g  · Choose: Nonfat frozen yogurt (1 cup) / 0.3 g  · Choose: Frozen fruit bar / trace  · Avoid: Whipped cream (1 tbs) / 3.5 g  · Choose: Nondairy whipped topping (1 tbs) / 1 g  Condiments / Saturated Fat (g)  · Avoid: Mayonnaise (1 tbs) / 2 g  · Choose: Low-fat mayonnaise (1 tbs) / 1 g  · Avoid: Butter (1 tbs) / 7 g  · Choose: Extra light margarine (1 tbs) / 1 g  · Avoid: Coconut oil (1 tbs) / 11.8 g  · Choose: Olive oil (1 tbs) / 1.8 g  · Choose: Corn oil (1 tbs) / 1.7 g  · Choose: Safflower oil (1 tbs) / 1.2 g  · Choose: Sunflower oil (1 tbs) / 1.4 g  · Choose: Soybean oil (1 tbs) / 2.4 g  · Choose: Canola oil (1 tbs) / 1 g  Document Released: 03/21/2005 Document Revised: 07/16/2012 Document Reviewed: 09/09/2010  [employer]® Patient Information ©2014 [employer].

## 2013-06-07 NOTE — Progress Notes (Signed)
DC instructions reviewed with patient, questions answered, verbalized understanding.  Patient transported via wheelchair to front of hospital to be taken home by wife.  Patient in good condition upon leaving [facility].

## 2013-06-11 ENCOUNTER — Encounter (HOSPITAL_COMMUNITY): Payer: Self-pay | Admitting: Pharmacy Technician

## 2013-06-14 ENCOUNTER — Encounter (HOSPITAL_COMMUNITY): Payer: Self-pay

## 2013-06-14 ENCOUNTER — Telehealth (INDEPENDENT_AMBULATORY_CARE_PROVIDER_SITE_OTHER): Payer: Self-pay | Admitting: General Surgery

## 2013-06-14 ENCOUNTER — Encounter (HOSPITAL_COMMUNITY)
Admission: RE | Admit: 2013-06-14 | Discharge: 2013-06-14 | Disposition: A | Discharge status: Home / Self Care | Payer: Medicare Other | Source: Ambulatory Visit | Attending: General Surgery | Admitting: General Surgery

## 2013-06-14 LAB — COMPREHENSIVE METABOLIC PANEL
ALK PHOS: 155 U/L — AB (ref 39–117)
ALT: 32 U/L (ref 0–53)
AST: 28 U/L (ref 0–37)
Albumin: 3.6 g/dL (ref 3.5–5.2)
BUN: 13 mg/dL (ref 6–23)
CALCIUM: 10.2 mg/dL (ref 8.4–10.5)
CO2: 26 mEq/L (ref 19–32)
Chloride: 104 mEq/L (ref 96–112)
Creatinine, Ser: 0.79 mg/dL (ref 0.50–1.35)
GFR calc Af Amer: >90 mL/min (ref >90)
GFR calc non Af Amer: 88 mL/min — ABNORMAL LOW (ref >90)
Glucose, Bld: 108 mg/dL — ABNORMAL HIGH (ref 70–99)
POTASSIUM: 4.9 meq/L (ref 3.7–5.3)
Sodium: 143 mEq/L (ref 137–147)
TOTAL PROTEIN: 7.6 g/dL (ref 6.0–8.3)
Total Bilirubin: 1.3 mg/dL — ABNORMAL HIGH (ref 0.3–1.2)

## 2013-06-14 LAB — CBC WITH DIFFERENTIAL/PLATELET
BASOS ABS: 0 10*3/uL (ref 0.0–0.1)
Basophils Relative: 0 % (ref 0–1)
EOS ABS: 0.4 10*3/uL (ref 0.0–0.7)
EOS PCT: 4 % (ref 0–5)
HCT: 41.7 % (ref 39.0–52.0)
Hemoglobin: 14.4 g/dL (ref 13.0–17.0)
Lymphocytes Relative: 17 % (ref 12–46)
Lymphs Abs: 1.6 10*3/uL (ref 0.7–4.0)
MCH: 29.5 pg (ref 26.0–34.0)
MCHC: 34.5 g/dL (ref 30.0–36.0)
MCV: 85.5 fL (ref 78.0–100.0)
Monocytes Absolute: 0.7 10*3/uL (ref 0.1–1.0)
Monocytes Relative: 7 % (ref 3–12)
NEUTROS PCT: 71 % (ref 43–77)
Neutro Abs: 6.9 10*3/uL (ref 1.7–7.7)
Platelets: 291 10*3/uL (ref 150–400)
RBC: 4.88 MIL/uL (ref 4.22–5.81)
RDW: 13.6 % (ref 11.5–15.5)
WBC: 9.6 10*3/uL (ref 4.0–10.5)

## 2013-06-14 LAB — LIPASE, BLOOD: Lipase: 153 U/L — ABNORMAL HIGH (ref 11–59)

## 2013-06-14 NOTE — Pre-Procedure Instructions (Addendum)
06-14-13 EKG 06-05-13,repeated today per Dr. Rica Mast. 06-14-13 1125 Noted call to discuss labs with patient by  Dr. Johna Sheriff per Epicnotes.

## 2013-06-14 NOTE — Telephone Encounter (Signed)
Called and discussed preop lab work

## 2013-06-14 NOTE — Patient Instructions (Addendum)
Jeremy Fisher  06/14/2013   Your procedure is scheduled on:   06-17-2013  Report to [facility] at    0730    AM   Call this number if you have problems the morning of surgery: [phone]  Or Presurgical Testing [phone](Bethenny Losee) For Living Will and/or Health Care Power Attorney Forms: please provide copy for your medical record,may bring AM of surgery(Forms should be already notarized -we do not provide this service).     Do not eat food:After Midnight.   Take these medicines the morning of surgery with A SIP OF WATER: none   Do not wear jewelry, make-up or nail polish.  Do not wear lotions, powders, or perfumes. You may wear deodorant.  Do not shave 48 hours(2 days) prior to first CHG shower(legs and under arms).(Shaving face and neck okay.)  Do not bring valuables to the hospital.(Hospital is not responsible for lost valuables).  Contacts, dentures or removable bridgework, body piercing, hair pins may not be worn into surgery.  Leave suitcase in the car. After surgery it may be brought to your room.  For patients admitted to the hospital, checkout time is 11:00 AM the day of discharge.(Restricted visitors-Any Persons displaying flu-like symptoms or illness).    Patients discharged the day of surgery will not be allowed to drive home. Must have responsible person with you x 24 hours once discharged.  Name and phone number of your driver:Margaret spouse -[phone] cell   Special Instructions: CHG(Chlorhedine 4%-"Hibiclens","Betasept","Aplicare") Shower Use Special Wash: see special instructions.(avoid face and genitals)     Failure to follow these instructions may result in Cancellation of your surgery.   Patient signature_______________________________________________________

## 2013-06-17 ENCOUNTER — Ambulatory Visit (HOSPITAL_COMMUNITY)
Admission: RE | Admit: 2013-06-17 | Discharge: 2013-06-17 | Disposition: A | Discharge status: Home / Self Care | Payer: Medicare Other | Source: Ambulatory Visit | Attending: General Surgery | Admitting: General Surgery

## 2013-06-17 ENCOUNTER — Encounter (HOSPITAL_COMMUNITY)
Admission: RE | Disposition: A | Discharge status: Home / Self Care | Payer: Self-pay | Source: Ambulatory Visit | Attending: General Surgery

## 2013-06-17 ENCOUNTER — Ambulatory Visit (HOSPITAL_COMMUNITY): Payer: Medicare Other

## 2013-06-17 ENCOUNTER — Ambulatory Visit (HOSPITAL_COMMUNITY): Payer: Medicare Other | Admitting: Anesthesiology

## 2013-06-17 ENCOUNTER — Encounter (HOSPITAL_COMMUNITY): Payer: Self-pay | Admitting: Registered Nurse

## 2013-06-17 ENCOUNTER — Encounter (HOSPITAL_COMMUNITY): Payer: Medicare Other | Admitting: Anesthesiology

## 2013-06-17 DIAGNOSIS — R1013 Epigastric pain: Secondary | ICD-10-CM | POA: Insufficient documentation

## 2013-06-17 DIAGNOSIS — K811 Chronic cholecystitis: Secondary | ICD-10-CM | POA: Insufficient documentation

## 2013-06-17 DIAGNOSIS — R7989 Other specified abnormal findings of blood chemistry: Secondary | ICD-10-CM | POA: Insufficient documentation

## 2013-06-17 DIAGNOSIS — K660 Peritoneal adhesions (postprocedural) (postinfection): Secondary | ICD-10-CM | POA: Insufficient documentation

## 2013-06-17 DIAGNOSIS — K838 Other specified diseases of biliary tract: Secondary | ICD-10-CM | POA: Insufficient documentation

## 2013-06-17 DIAGNOSIS — K859 Acute pancreatitis without necrosis or infection, unspecified: Secondary | ICD-10-CM | POA: Insufficient documentation

## 2013-06-17 DIAGNOSIS — Z86711 Personal history of pulmonary embolism: Secondary | ICD-10-CM | POA: Insufficient documentation

## 2013-06-17 HISTORY — PX: CHOLECYSTECTOMY: SHX55

## 2013-06-17 SURGERY — LAPAROSCOPIC CHOLECYSTECTOMY WITH INTRAOPERATIVE CHOLANGIOGRAM
Anesthesia: General

## 2013-06-17 MED ORDER — ROCURONIUM BROMIDE 100 MG/10ML IV SOLN
INTRAVENOUS | Status: AC
  Filled 2013-06-17: qty 1

## 2013-06-17 MED ORDER — PROPOFOL 10 MG/ML IV BOLUS
INTRAVENOUS | Status: AC
  Filled 2013-06-17: qty 20

## 2013-06-17 MED ORDER — PHENYLEPHRINE HCL 10 MG/ML IJ SOLN
INTRAMUSCULAR | Status: DC | PRN
  Administered 2013-06-17 (×4): 80 ug via INTRAVENOUS

## 2013-06-17 MED ORDER — GLYCOPYRROLATE 0.2 MG/ML IJ SOLN
INTRAMUSCULAR | Status: AC
Start: 2013-06-17 — End: 2013-06-17
  Filled 2013-06-17: qty 1

## 2013-06-17 MED ORDER — ONDANSETRON HCL 4 MG/2ML IJ SOLN
INTRAMUSCULAR | Status: DC | PRN
  Administered 2013-06-17: 4 mg via INTRAVENOUS

## 2013-06-17 MED ORDER — ROCURONIUM BROMIDE 100 MG/10ML IV SOLN
INTRAVENOUS | Status: DC | PRN
  Administered 2013-06-17: 35 mg via INTRAVENOUS
  Administered 2013-06-17: 5 mg via INTRAVENOUS

## 2013-06-17 MED ORDER — LIDOCAINE HCL (CARDIAC) 20 MG/ML IV SOLN
INTRAVENOUS | Status: DC | PRN
  Administered 2013-06-17: 50 mg via INTRAVENOUS
  Administered 2013-06-17: 20 mg via INTRAVENOUS

## 2013-06-17 MED ORDER — SODIUM CHLORIDE 0.9 % IJ SOLN
INTRAMUSCULAR | Status: AC
  Filled 2013-06-17: qty 10

## 2013-06-17 MED ORDER — DEXAMETHASONE SODIUM PHOSPHATE 10 MG/ML IJ SOLN
INTRAMUSCULAR | Status: DC | PRN
  Administered 2013-06-17: 10 mg via INTRAVENOUS

## 2013-06-17 MED ORDER — CIPROFLOXACIN IN D5W 400 MG/200ML IV SOLN
INTRAVENOUS | Status: AC
  Filled 2013-06-17: qty 200

## 2013-06-17 MED ORDER — GLYCOPYRROLATE 0.2 MG/ML IJ SOLN
INTRAMUSCULAR | Status: AC
  Filled 2013-06-17: qty 1

## 2013-06-17 MED ORDER — 0.9 % SODIUM CHLORIDE (POUR BTL) OPTIME
TOPICAL | Status: DC | PRN
  Administered 2013-06-17: 1000 mL

## 2013-06-17 MED ORDER — CHLORHEXIDINE GLUCONATE 4 % EX LIQD: 1.0000 "application " | Freq: Once | CUTANEOUS | Status: DC

## 2013-06-17 MED ORDER — ENOXAPARIN SODIUM 40 MG/0.4ML ~~LOC~~ SOLN
40.0000 mg | Freq: Once | SUBCUTANEOUS | Status: AC
  Administered 2013-06-17: 40 mg via SUBCUTANEOUS
  Filled 2013-06-17: qty 0.4

## 2013-06-17 MED ORDER — HYDROCODONE-ACETAMINOPHEN 5-325 MG PO TABS: 1.0000 | ORAL_TABLET | ORAL | Status: DC | PRN

## 2013-06-17 MED ORDER — NEOSTIGMINE METHYLSULFATE 1 MG/ML IJ SOLN
INTRAMUSCULAR | Status: DC | PRN
  Administered 2013-06-17: 3 mg via INTRAVENOUS

## 2013-06-17 MED ORDER — GLYCOPYRROLATE 0.2 MG/ML IJ SOLN
INTRAMUSCULAR | Status: DC | PRN
  Administered 2013-06-17: 0.4 mg via INTRAVENOUS

## 2013-06-17 MED ORDER — LACTATED RINGERS IV SOLN
INTRAVENOUS | Status: DC | PRN
  Administered 2013-06-17 (×2): via INTRAVENOUS

## 2013-06-17 MED ORDER — BUPIVACAINE-EPINEPHRINE 0.25% -1:200000 IJ SOLN
INTRAMUSCULAR | Status: AC
  Filled 2013-06-17: qty 1

## 2013-06-17 MED ORDER — PROPOFOL 10 MG/ML IV BOLUS
INTRAVENOUS | Status: DC | PRN
  Administered 2013-06-17: 170 mg via INTRAVENOUS

## 2013-06-17 MED ORDER — BUPIVACAINE-EPINEPHRINE 0.25% -1:200000 IJ SOLN
INTRAMUSCULAR | Status: DC | PRN
  Administered 2013-06-17: 30 mL

## 2013-06-17 MED ORDER — SUCCINYLCHOLINE CHLORIDE 20 MG/ML IJ SOLN
INTRAMUSCULAR | Status: DC | PRN
  Administered 2013-06-17: 100 mg via INTRAVENOUS

## 2013-06-17 MED ORDER — NEOSTIGMINE METHYLSULFATE 1 MG/ML IJ SOLN
INTRAMUSCULAR | Status: AC
  Filled 2013-06-17: qty 10

## 2013-06-17 MED ORDER — MIDAZOLAM HCL 5 MG/5ML IJ SOLN
INTRAMUSCULAR | Status: DC | PRN
  Administered 2013-06-17: 2 mg via INTRAVENOUS

## 2013-06-17 MED ORDER — SUCCINYLCHOLINE CHLORIDE 20 MG/ML IJ SOLN
INTRAMUSCULAR | Status: AC
  Filled 2013-06-17: qty 1

## 2013-06-17 MED ORDER — MIDAZOLAM HCL 2 MG/2ML IJ SOLN
INTRAMUSCULAR | Status: AC
  Filled 2013-06-17: qty 2

## 2013-06-17 MED ORDER — SODIUM CHLORIDE 0.9 % IJ SOLN
INTRAMUSCULAR | Status: DC | PRN
  Administered 2013-06-17: 11:00:00

## 2013-06-17 MED ORDER — LACTATED RINGERS IR SOLN
Status: DC | PRN
Start: 2013-06-17 — End: 2013-06-17
  Administered 2013-06-17: 1

## 2013-06-17 MED ORDER — SUFENTANIL CITRATE 50 MCG/ML IV SOLN
INTRAVENOUS | Status: AC
  Filled 2013-06-17: qty 1

## 2013-06-17 MED ORDER — ONDANSETRON HCL 4 MG PO TABS: 4.0000 mg | ORAL_TABLET | Freq: Three times a day (TID) | ORAL | Status: DC | PRN

## 2013-06-17 MED ORDER — SUFENTANIL CITRATE 50 MCG/ML IV SOLN
INTRAVENOUS | Status: DC | PRN
  Administered 2013-06-17: 15 ug via INTRAVENOUS
  Administered 2013-06-17: 10 ug via INTRAVENOUS
  Administered 2013-06-17: 15 ug via INTRAVENOUS
  Administered 2013-06-17: 10 ug via INTRAVENOUS

## 2013-06-17 MED ORDER — CIPROFLOXACIN IN D5W 400 MG/200ML IV SOLN
400.0000 mg | INTRAVENOUS | Status: AC
  Administered 2013-06-17: 400 mg via INTRAVENOUS

## 2013-06-17 MED ORDER — ONDANSETRON HCL 4 MG/2ML IJ SOLN
INTRAMUSCULAR | Status: AC
  Filled 2013-06-17: qty 2

## 2013-06-17 MED ORDER — DEXAMETHASONE SODIUM PHOSPHATE 10 MG/ML IJ SOLN
INTRAMUSCULAR | Status: AC
  Filled 2013-06-17: qty 1

## 2013-06-17 MED ORDER — LIDOCAINE HCL (CARDIAC) 20 MG/ML IV SOLN
INTRAVENOUS | Status: AC
  Filled 2013-06-17: qty 5

## 2013-06-17 SURGICAL SUPPLY — 36 items
APPLIER CLIP ROT 10 11.4 M/L (STAPLE) ×3
CANISTER SUCTION 2500CC (MISCELLANEOUS) ×3 IMPLANT
CATH REDDICK CHOLANGI 4FR 50CM (CATHETERS) IMPLANT
CHLORAPREP W/TINT 26ML (MISCELLANEOUS) ×3 IMPLANT
CLIP APPLIE ROT 10 11.4 M/L (STAPLE) ×1 IMPLANT
COVER MAYO STAND STRL (DRAPES) ×3 IMPLANT
DECANTER SPIKE VIAL GLASS SM (MISCELLANEOUS) ×3 IMPLANT
DERMABOND ADVANCED (GAUZE/BANDAGES/DRESSINGS) ×2
DERMABOND ADVANCED .7 DNX12 (GAUZE/BANDAGES/DRESSINGS) ×1 IMPLANT
DRAPE C-ARM 42X120 X-RAY (DRAPES) ×3 IMPLANT
DRAPE LAPAROSCOPIC ABDOMINAL (DRAPES) ×3 IMPLANT
DRAPE UTILITY XL STRL (DRAPES) IMPLANT
ELECT REM PT RETURN 9FT ADLT (ELECTROSURGICAL) ×3
ELECTRODE REM PT RTRN 9FT ADLT (ELECTROSURGICAL) ×1 IMPLANT
GLOVE BIOGEL PI IND STRL 7.5 (GLOVE) ×2 IMPLANT
GLOVE BIOGEL PI INDICATOR 7.5 (GLOVE) ×4
GLOVE SS BIOGEL STRL SZ 7.5 (GLOVE) ×2 IMPLANT
GLOVE SUPERSENSE BIOGEL SZ 7.5 (GLOVE) ×4
GOWN STRL REUS W/TWL XL LVL3 (GOWN DISPOSABLE) ×12 IMPLANT
HEMOSTAT SNOW SURGICEL 2X4 (HEMOSTASIS) ×3 IMPLANT
KIT BASIN OR (CUSTOM PROCEDURE TRAY) ×3 IMPLANT
NS IRRIG 1000ML POUR BTL (IV SOLUTION) ×3 IMPLANT
POUCH SPECIMEN RETRIEVAL 10MM (ENDOMECHANICALS) ×3 IMPLANT
SCISSORS LAP 5X35 DISP (ENDOMECHANICALS) ×3 IMPLANT
SET CHOLANGIOGRAPH MIX (MISCELLANEOUS) ×3 IMPLANT
SET IRRIG TUBING LAPAROSCOPIC (IRRIGATION / IRRIGATOR) ×3 IMPLANT
SLEEVE XCEL OPT CAN 5 100 (ENDOMECHANICALS) ×3 IMPLANT
SOLUTION ANTI FOG 6CC (MISCELLANEOUS) ×3 IMPLANT
SUT MNCRL AB 4-0 PS2 18 (SUTURE) ×3 IMPLANT
TOWEL OR 17X26 10 PK STRL BLUE (TOWEL DISPOSABLE) ×3 IMPLANT
TOWEL OR NON WOVEN STRL DISP B (DISPOSABLE) ×3 IMPLANT
TRAY LAP CHOLE (CUSTOM PROCEDURE TRAY) ×3 IMPLANT
TROCAR BLADELESS OPT 5 100 (ENDOMECHANICALS) ×3 IMPLANT
TROCAR XCEL BLUNT TIP 100MML (ENDOMECHANICALS) ×3 IMPLANT
TROCAR XCEL NON-BLD 11X100MML (ENDOMECHANICALS) ×3 IMPLANT
TUBING INSUFFLATION 10FT LAP (TUBING) ×3 IMPLANT

## 2013-06-17 NOTE — Op Note (Signed)
Preoperative Diagnosis: gallstones   Postoprative Diagnosis: gallstones   Procedure: Procedure(s): LAPAROSCOPIC CHOLECYSTECTOMY WITH INTRAOPERATIVE CHOLANGIOGRAM   Surgeon: Glenna Fellows T   Assistants: Darnell Level  Anesthesia:  General endotracheal anesthesia  Indications: patient is a 73 year old male who recently presented with sudden epigastric pain and transiently markedly elevated lipase and LFTs. His pain quickly resolved and lipase and LFTs have returned to near normal. Gallbladder ultrasound showed some thickening of the gallbladder wall but no definite stones. He remains asymptomatic. We have elected to proceed with laparoscopic cholecystectomy with cholangiogram for presumed gallstone pancreatitis.  Procedure Detail:  Patient was brought to the operating room, placed in the supine position on the operating table, and general endotracheal anesthesia induced. He received preoperative subcutaneous Lovenox 40 mg and IV antibiotics. PAS were in place. The abdomen was sterilely prepped and draped. Patient time out was performed and correct procedure verified. Access was obtained at the umbilicus with a 1 cm incision with an open Hassan technique through a mattress suture of 0 Vicryl without difficulty and pneumoperitoneum established. Under direct vision an 11 mm trocar was placed subxiphoid and 25 mm trochars along the right subcostal margin. Liver appeared normal. The gallbladder appeared slightly thickened with some omental adhesions. The fundus was grasped and elevated up over the liver and omental adhesions were taken down with careful cautery and blunt dissection. The infundibulum was exposed and fibrofatty tissue was stripped off of the gallbladder toward the porta hepatus. Peritoneum anterior posterior close triangle was incised. Further distal dissection clearly identified the cystic artery in close triangle coursing up onto the gallbladder wall and the cystic duct gallbladder  junction was dissected 360 and a good critical view obtained. The cystic artery was then divided between 2 proximal and one distal clip. The cystic duct was clipped at the gallbladder junction an operative cholangiogram obtained through the cystic duct. There was mild dilatation of the common bile duct. Intrahepatic ducts appeared normal. There was flow into the duodenum with either a little edema at the ampulla or possibly a small nonobstructing filling defect. The Cholangiocath was removed and the cystic duct was triply clipped proximally and divided. The gallbladder was dissected free from its bed and removed intact, placed in an Endo Catch bag and brought out through the umbilicus. The gallbladder bed was irrigated and complete hemostasis obtained with cautery and a Surgicel pack. There was no evidence of trocar injury or bleeding. All CO2 was evacuated and trochars were removed. The mattress suture was secured the umbilicus. Skin incisions were closed with subcutaneous Monocryl and Dermabond. Sponge needle and this accounts were correct.    Findings: As above  Estimated Blood Loss:  Minimal         Drains: none  Blood Given: none          Specimens: gallbladder and contents        Complications:  * No complications entered in OR log *         Disposition: PACU - hemodynamically stable.         Condition: stable

## 2013-06-17 NOTE — Interval H&P Note (Signed)
History and Physical Interval Note:  06/17/2013 9:16 AM  Jeremy Fisher  has presented today for surgery, with the diagnosis of gallstones   The various methods of treatment have been discussed with the patient and family. After consideration of risks, benefits and other options for treatment, the patient has consented to  Procedure(s): LAPAROSCOPIC CHOLECYSTECTOMY WITH INTRAOPERATIVE CHOLANGIOGRAM (N/A) as a surgical intervention .  The patient's history has been reviewed, patient examined, no change in status, stable for surgery.  I have reviewed the patient's chart and labs.  Questions were answered to the patient's satisfaction.     [name]

## 2013-06-17 NOTE — Anesthesia Postprocedure Evaluation (Signed)
  Anesthesia Post-op Note  Patient: Jeremy Fisher  Procedure(s) Performed: Procedure(s) (LRB): LAPAROSCOPIC CHOLECYSTECTOMY WITH INTRAOPERATIVE CHOLANGIOGRAM (N/A)  Patient Location: PACU  Anesthesia Type: General  Level of Consciousness: awake and alert   Airway and Oxygen Therapy: Patient Spontanous Breathing  Post-op Pain: mild  Post-op Assessment: Post-op Vital signs reviewed, Patient's Cardiovascular Status Stable, Respiratory Function Stable, Patent Airway and No signs of Nausea or vomiting  Last Vitals:  Filed Vitals:   06/17/13 1157  BP: 109/64  Pulse: 61  Temp: 36.2 C  Resp: 12    Post-op Vital Signs: stable   Complications: No apparent anesthesia complications

## 2013-06-17 NOTE — Anesthesia Preprocedure Evaluation (Signed)

## 2013-06-17 NOTE — Transfer of Care (Signed)
Immediate Anesthesia Transfer of Care Note  Patient: Jeremy Fisher  Procedure(s) Performed: Procedure(s): LAPAROSCOPIC CHOLECYSTECTOMY WITH INTRAOPERATIVE CHOLANGIOGRAM (N/A)  Patient Location: PACU  Anesthesia Type:General  Level of Consciousness: awake, alert , oriented and patient cooperative  Airway & Oxygen Therapy: Patient Spontanous Breathing and Patient connected to face mask oxygen  Post-op Assessment: Report given to PACU RN, Post -op Vital signs reviewed and stable and Patient moving all extremities X 4  Post vital signs: stable  Complications: No apparent anesthesia complications

## 2013-06-17 NOTE — Discharge Instructions (Signed)
[facility] ______[facility] °LAPAROSCOPIC SURGERY: POST OP INSTRUCTIONS °Always review your discharge instruction sheet given to you by the facility where your surgery was performed. °IF YOU HAVE DISABILITY OR FAMILY LEAVE FORMS, YOU MUST BRING THEM TO THE OFFICE FOR PROCESSING.   °DO NOT GIVE THEM TO YOUR DOCTOR. ° °1. A prescription for pain medication may be given to you upon discharge.  Take your pain medication as prescribed, if needed.  If narcotic pain medicine is not needed, then you may take acetaminophen (Tylenol) or ibuprofen (Advil) as needed. °2. Take your usually prescribed medications unless otherwise directed. °3. If you need a refill on your pain medication, please contact your pharmacy.  They will contact our office to request authorization. Prescriptions will not be filled after 5pm or on week-ends. °4. You should follow a light diet the first few days after arrival home, such as soup and crackers, etc.  Be sure to include lots of fluids daily. °5. Most patients will experience some swelling and bruising in the area of the incisions.  Ice packs will help.  Swelling and bruising can take several days to resolve.  °6. It is common to experience some constipation if taking pain medication after surgery.  Increasing fluid intake and taking a stool softener (such as Colace) will usually help or prevent this problem from occurring.  A mild laxative (Milk of Magnesia or Miralax) should be taken according to package instructions if there are no bowel movements after 48 hours. °7. Unless discharge instructions indicate otherwise, you may remove your bandages 24-48 hours after surgery, and you may shower at that time.  You may have steri-strips (small skin tapes) in place directly over the incision.  These strips should be left on the skin for 7-10 days.  If your surgeon used skin glue on the incision, you may shower in 24 hours.  The glue will flake off over the next 2-3 weeks.  Any sutures or  staples will be removed at the office during your follow-up visit. °8. ACTIVITIES:  You may resume regular (light) daily activities beginning the next day--such as daily self-care, walking, climbing stairs--gradually increasing activities as tolerated.  You may have sexual intercourse when it is comfortable.  Refrain from any heavy lifting or straining until approved by your doctor. °a. You may drive when you are no longer taking prescription pain medication, you can comfortably wear a seatbelt, and you can safely maneuver your car and apply brakes. °b. RETURN TO WORK:  __________________________________________________________ °9. You should see your doctor in the office for a follow-up appointment approximately 2-3 weeks after your surgery.  Make sure that you call for this appointment within a day or two after you arrive home to insure a convenient appointment time. °10. OTHER INSTRUCTIONS: __________________________________________________________________________________________________________________________ __________________________________________________________________________________________________________________________ °WHEN TO CALL YOUR DOCTOR: °1. Fever over 101.0 °2. Inability to urinate °3. Continued bleeding from incision. °4. Increased pain, redness, or drainage from the incision. °5. Increasing abdominal pain ° °The clinic staff is available to answer your questions during regular business hours.  Please don’t hesitate to call and ask to speak to one of the nurses for clinical concerns.  If you have a medical emergency, go to the nearest emergency room or call 911.  A surgeon from [facility] is always on call at the hospital. °[address], [address], [place], [place]  [address] ? [address] [address], , [place]   [address] °[phone] ? [phone] ? FAX [phone] °Web site:   [email or web address] °

## 2013-06-17 NOTE — H&P (View-Only) (Signed)
Jeremy Fisher is an 73 y.o. male.   Chief Complaint: abdominal pain, vomiting  HPI: Patient was in his usual state of good health until 3 days ago when after dinner he developed fairly rapid onset of epigastric abdominal pain. He describes aching and cramping pain that came in waves, waxing and waning. The pain continued and the following day, 2 days ago he developed nausea and vomiting. Vomiting has been nonbilious. The symptoms continued to into today with continued epigastric abdominal pain and vomiting. He has noticed very dark urine. He called and I asked him to come to the emergency room for evaluation. A couple of months ago he had an episode of mild abdominal pain and nausea that resolved after a day or 2. He does not drink alcohol. Otherwise he has no chronic or ongoing GI complaints. He had normal bowel movements the last 2 days. No melena or hematochezia. He has felt chills but no fever.  Past Medical History  Diagnosis Date  . Pulmonary embolism     Past Surgical History  Procedure Laterality Date  . Hernia repair      No family history on file. Social History:  Rare alcohol and none recently. Does not smoke  Allergies:  Allergies  Allergen Reactions  . Primaxin [Imipenem] Other (See Comments)    seizure   Current Facility-Administered Medications  Medication Dose Route Frequency Provider Last Rate Last Dose  . 0.9 %  sodium chloride infusion  1,000 mL Intravenous Continuous Hoy Morn, MD 125 mL/hr at 06/05/13 1900 1,000 mL at 06/05/13 1900   Current Outpatient Prescriptions  Medication Sig Dispense Refill  . ibuprofen (ADVIL,MOTRIN) 200 MG tablet Take 400 mg by mouth every 6 (six) hours as needed for moderate pain.         Results for orders placed during the hospital encounter of 06/05/13 (from the past 48 hour(s))  CBC WITH DIFFERENTIAL     Status: Abnormal   Collection Time    06/05/13  5:26 PM      Result Value Ref Range   WBC 13.1 (*) 4.0 - 10.5  K/uL   RBC 5.03  4.22 - 5.81 MIL/uL   Hemoglobin 15.2  13.0 - 17.0 g/dL   HCT 41.7  39.0 - 52.0 %   MCV 82.9  78.0 - 100.0 fL   MCH 30.2  26.0 - 34.0 pg   MCHC 36.5 (*) 30.0 - 36.0 g/dL   RDW 13.5  11.5 - 15.5 %   Platelets 167  150 - 400 K/uL   Neutrophils Relative % 86 (*) 43 - 77 %   Neutro Abs 11.3 (*) 1.7 - 7.7 K/uL   Lymphocytes Relative 2 (*) 12 - 46 %   Lymphs Abs 0.3 (*) 0.7 - 4.0 K/uL   Monocytes Relative 11  3 - 12 %   Monocytes Absolute 1.5 (*) 0.1 - 1.0 K/uL   Eosinophils Relative 1  0 - 5 %   Eosinophils Absolute 0.1  0.0 - 0.7 K/uL   Basophils Relative 0  0 - 1 %   Basophils Absolute 0.0  0.0 - 0.1 K/uL  COMPREHENSIVE METABOLIC PANEL     Status: Abnormal   Collection Time    06/05/13  5:26 PM      Result Value Ref Range   Sodium 138  137 - 147 mEq/L   Potassium 3.5 (*) 3.7 - 5.3 mEq/L   Chloride 99  96 - 112 mEq/L   CO2 20  19 -  32 mEq/L   Glucose, Bld 159 (*) 70 - 99 mg/dL   BUN 19  6 - 23 mg/dL   Creatinine, Ser 0.74  0.50 - 1.35 mg/dL   Calcium 10.0  8.4 - 10.5 mg/dL   Total Protein 7.7  6.0 - 8.3 g/dL   Albumin 3.7  3.5 - 5.2 g/dL   AST 112 (*) 0 - 37 U/L   ALT 132 (*) 0 - 53 U/L   Alkaline Phosphatase 233 (*) 39 - 117 U/L   Total Bilirubin 8.7 (*) 0.3 - 1.2 mg/dL   GFR calc non Af Amer >90  >90 mL/min   GFR calc Af Amer >90  >90 mL/min   Comment: (NOTE)     The eGFR has been calculated using the CKD EPI equation.     This calculation has not been validated in all clinical situations.     eGFR's persistently <90 mL/min signify possible Chronic Kidney     Disease.  LIPASE, BLOOD     Status: Abnormal   Collection Time    06/05/13  5:26 PM      Result Value Ref Range   Lipase 2003 (*) 11 - 59 U/L  I-STAT TROPOININ, ED     Status: None   Collection Time    06/05/13  6:12 PM      Result Value Ref Range   Troponin i, poc 0.06  0.00 - 0.08 ng/mL   Comment 3            Comment: Due to the release kinetics of cTnI,     a negative result within the  first hours     of the onset of symptoms does not rule out     myocardial infarction with certainty.     If myocardial infarction is still suspected,     repeat the test at appropriate intervals.   No results found.  Review of Systems  Constitutional: Positive for chills and malaise/fatigue. Negative for fever.  HENT: Negative.   Respiratory: Negative.   Cardiovascular: Negative.   Gastrointestinal: Positive for nausea, vomiting and abdominal pain. Negative for diarrhea, constipation, blood in stool and melena.  Genitourinary: Positive for hematuria and flank pain.       Dark urine  Musculoskeletal: Negative.     Blood pressure 106/38, pulse 97, temperature 99.6 F (37.6 C), temperature source Oral, resp. rate 16, height '5\' 9"'  (1.753 m), weight 175 lb (79.379 kg), SpO2 94.00%. Physical Exam  General: Alert, well-developed Caucasian male, in no distress Skin: Warm and dry without rash or infection. Probable jaundice. HEENT:  Sclera appear icteric. Pupils equal round and reactive.  Lungs: Breath sounds clear and equal without increased work of breathing Cardiovascular:mild tachycardia without murmur. No JVD or edema.  Abdomen: Nondistended.bowel sounds are present. There is mild epigastric tenderness without guarding or palpable mass. No organomegaly. There is a very tiny nontender reducible umbilical hernia. Extremities: No edema or joint swelling or deformity. No chronic venous stasis changes. Neurologic: Alert and fully oriented.   Assessment/Plan Acute abdominal pain and vomiting with lab work showing acute pancreatitis and elevated LFTs. He is a non-drinker and most likely this represents gallstone pancreatitis. We will proceed with CT scan of the abdomen and pelvis and ultrasound of the gallbladder and he will be admitted for treatment with IV fluids, pain medication and antiemetics.  Jenya Putz T 06/05/2013, 7:17 PM

## 2013-06-18 ENCOUNTER — Encounter (HOSPITAL_COMMUNITY): Payer: Self-pay | Admitting: General Surgery

## 2014-05-16 DIAGNOSIS — R109 Unspecified abdominal pain: Secondary | ICD-10-CM | POA: Diagnosis not present

## 2014-05-19 ENCOUNTER — Other Ambulatory Visit (INDEPENDENT_AMBULATORY_CARE_PROVIDER_SITE_OTHER): Payer: Self-pay

## 2014-05-19 DIAGNOSIS — K859 Acute pancreatitis, unspecified: Secondary | ICD-10-CM

## 2014-05-20 ENCOUNTER — Ambulatory Visit (HOSPITAL_COMMUNITY)
Admission: RE | Admit: 2014-05-20 | Discharge: 2014-05-20 | Disposition: A | Discharge status: Home / Self Care | Payer: Medicare Other | Source: Ambulatory Visit | Attending: Gastroenterology | Admitting: Gastroenterology

## 2014-05-20 ENCOUNTER — Other Ambulatory Visit: Payer: Self-pay | Admitting: Gastroenterology

## 2014-05-20 ENCOUNTER — Other Ambulatory Visit: Payer: Self-pay | Admitting: *Deleted

## 2014-05-20 ENCOUNTER — Other Ambulatory Visit (INDEPENDENT_AMBULATORY_CARE_PROVIDER_SITE_OTHER): Payer: Medicare Other

## 2014-05-20 DIAGNOSIS — Z9049 Acquired absence of other specified parts of digestive tract: Secondary | ICD-10-CM | POA: Diagnosis not present

## 2014-05-20 DIAGNOSIS — R109 Unspecified abdominal pain: Secondary | ICD-10-CM

## 2014-05-20 DIAGNOSIS — K838 Other specified diseases of biliary tract: Secondary | ICD-10-CM | POA: Insufficient documentation

## 2014-05-20 DIAGNOSIS — K858 Other acute pancreatitis without necrosis or infection: Secondary | ICD-10-CM

## 2014-05-20 DIAGNOSIS — K8050 Calculus of bile duct without cholangitis or cholecystitis without obstruction: Secondary | ICD-10-CM

## 2014-05-20 LAB — COMPREHENSIVE METABOLIC PANEL
ALBUMIN: 4.3 g/dL (ref 3.5–5.2)
ALK PHOS: 256 U/L — AB (ref 39–117)
ALT: 89 U/L — ABNORMAL HIGH (ref 0–53)
AST: 44 U/L — ABNORMAL HIGH (ref 0–37)
BUN: 17 mg/dL (ref 6–23)
CALCIUM: 10.8 mg/dL — AB (ref 8.4–10.5)
CHLORIDE: 102 meq/L (ref 96–112)
CO2: 30 mEq/L (ref 19–32)
Creatinine, Ser: 0.95 mg/dL (ref 0.40–1.50)
GFR: 82.61 mL/min (ref >60.00)
GLUCOSE: 101 mg/dL — AB (ref 70–99)
POTASSIUM: 3.7 meq/L (ref 3.5–5.1)
Sodium: 139 mEq/L (ref 135–145)
Total Bilirubin: 6.9 mg/dL — ABNORMAL HIGH (ref 0.2–1.2)
Total Protein: 8.2 g/dL (ref 6.0–8.3)

## 2014-05-20 LAB — CBC WITH DIFFERENTIAL/PLATELET
BASOS ABS: 0 10*3/uL (ref 0.0–0.1)
Basophils Relative: 0.5 % (ref 0.0–3.0)
EOS ABS: 0.4 10*3/uL (ref 0.0–0.7)
Eosinophils Relative: 4.3 % (ref 0.0–5.0)
HEMATOCRIT: 46.1 % (ref 39.0–52.0)
Hemoglobin: 16.1 g/dL (ref 13.0–17.0)
LYMPHS PCT: 13.1 % (ref 12.0–46.0)
Lymphs Abs: 1.2 10*3/uL (ref 0.7–4.0)
MCHC: 35 g/dL (ref 30.0–36.0)
MCV: 85.6 fl (ref 78.0–100.0)
Monocytes Absolute: 0.7 10*3/uL (ref 0.1–1.0)
Monocytes Relative: 7.6 % (ref 3.0–12.0)
NEUTROS ABS: 6.6 10*3/uL (ref 1.4–7.7)
Neutrophils Relative %: 74.5 % (ref 43.0–77.0)
PLATELETS: 238 10*3/uL (ref 150.0–400.0)
RBC: 5.39 Mil/uL (ref 4.22–5.81)
RDW: 13.7 % (ref 11.5–15.5)
WBC: 8.9 10*3/uL (ref 4.0–10.5)

## 2014-05-20 LAB — PROTIME-INR
INR: 1.1 ratio — ABNORMAL HIGH (ref 0.8–1.0)
PROTHROMBIN TIME: 12 s (ref 9.6–13.1)

## 2014-05-20 LAB — LIPASE: LIPASE: 229 U/L — AB (ref 11.0–59.0)

## 2014-05-20 LAB — AMYLASE: Amylase: 75 U/L (ref 27–131)

## 2014-05-20 MED ORDER — CIPROFLOXACIN HCL 500 MG PO TABS: 500.0000 mg | ORAL_TABLET | Freq: Two times a day (BID) | ORAL | Status: DC

## 2014-05-21 ENCOUNTER — Other Ambulatory Visit: Payer: Self-pay | Admitting: *Deleted

## 2014-05-21 ENCOUNTER — Encounter (HOSPITAL_COMMUNITY): Payer: Self-pay | Admitting: *Deleted

## 2014-05-21 DIAGNOSIS — K8033 Calculus of bile duct with acute cholangitis with obstruction: Secondary | ICD-10-CM

## 2014-05-22 ENCOUNTER — Ambulatory Visit (HOSPITAL_COMMUNITY): Payer: Medicare Other | Admitting: Anesthesiology

## 2014-05-22 ENCOUNTER — Encounter (HOSPITAL_COMMUNITY)
Admission: RE | Disposition: A | Discharge status: Home / Self Care | Payer: Self-pay | Source: Ambulatory Visit | Attending: Gastroenterology

## 2014-05-22 ENCOUNTER — Ambulatory Visit (HOSPITAL_COMMUNITY)
Admission: RE | Admit: 2014-05-22 | Discharge: 2014-05-22 | Disposition: A | Discharge status: Home / Self Care | Payer: Medicare Other | Source: Ambulatory Visit | Attending: Gastroenterology | Admitting: Gastroenterology

## 2014-05-22 ENCOUNTER — Ambulatory Visit (HOSPITAL_COMMUNITY): Payer: Medicare Other

## 2014-05-22 ENCOUNTER — Encounter (HOSPITAL_COMMUNITY): Payer: Self-pay | Admitting: *Deleted

## 2014-05-22 DIAGNOSIS — K8050 Calculus of bile duct without cholangitis or cholecystitis without obstruction: Secondary | ICD-10-CM | POA: Insufficient documentation

## 2014-05-22 DIAGNOSIS — Z8719 Personal history of other diseases of the digestive system: Secondary | ICD-10-CM | POA: Insufficient documentation

## 2014-05-22 DIAGNOSIS — Z9049 Acquired absence of other specified parts of digestive tract: Secondary | ICD-10-CM | POA: Diagnosis not present

## 2014-05-22 DIAGNOSIS — K8033 Calculus of bile duct with acute cholangitis with obstruction: Secondary | ICD-10-CM | POA: Diagnosis not present

## 2014-05-22 DIAGNOSIS — K859 Acute pancreatitis, unspecified: Secondary | ICD-10-CM | POA: Diagnosis not present

## 2014-05-22 DIAGNOSIS — R1033 Periumbilical pain: Secondary | ICD-10-CM | POA: Diagnosis present

## 2014-05-22 DIAGNOSIS — K8040 Calculus of bile duct with cholecystitis, unspecified, without obstruction: Secondary | ICD-10-CM | POA: Diagnosis not present

## 2014-05-22 DIAGNOSIS — K831 Obstruction of bile duct: Secondary | ICD-10-CM | POA: Diagnosis not present

## 2014-05-22 HISTORY — PX: SPYGLASS CHOLANGIOSCOPY: SHX5441

## 2014-05-22 HISTORY — PX: ERCP: SHX5425

## 2014-05-22 SURGERY — ERCP, WITH INTERVENTION IF INDICATED
Anesthesia: Monitor Anesthesia Care

## 2014-05-22 SURGERY — ERCP, WITH INTERVENTION IF INDICATED
Anesthesia: General

## 2014-05-22 MED ORDER — LACTATED RINGERS IV SOLN
INTRAVENOUS | Status: DC | PRN
  Administered 2014-05-22 (×2): via INTRAVENOUS

## 2014-05-22 MED ORDER — ONDANSETRON HCL 4 MG/2ML IJ SOLN
INTRAMUSCULAR | Status: DC | PRN
  Administered 2014-05-22: 4 mg via INTRAVENOUS

## 2014-05-22 MED ORDER — FENTANYL CITRATE 0.05 MG/ML IJ SOLN
INTRAMUSCULAR | Status: DC | PRN
  Administered 2014-05-22: 50 ug via INTRAVENOUS

## 2014-05-22 MED ORDER — CIPROFLOXACIN IN D5W 400 MG/200ML IV SOLN
INTRAVENOUS | Status: AC
  Filled 2014-05-22: qty 200

## 2014-05-22 MED ORDER — PROPOFOL 10 MG/ML IV BOLUS
INTRAVENOUS | Status: DC | PRN
  Administered 2014-05-22: 130 mg via INTRAVENOUS

## 2014-05-22 MED ORDER — LIDOCAINE HCL (CARDIAC) 20 MG/ML IV SOLN
INTRAVENOUS | Status: DC | PRN
  Administered 2014-05-22: 100 mg via INTRAVENOUS

## 2014-05-22 MED ORDER — INDOMETHACIN 50 MG RE SUPP
50.0000 mg | Freq: Once | RECTAL | Status: AC
  Administered 2014-05-22: 50 mg via RECTAL
  Filled 2014-05-22: qty 1

## 2014-05-22 MED ORDER — IOHEXOL 350 MG/ML SOLN
INTRAVENOUS | Status: DC | PRN
  Administered 2014-05-22: 13:00:00

## 2014-05-22 MED ORDER — GLUCAGON HCL RDNA (DIAGNOSTIC) 1 MG IJ SOLR
INTRAMUSCULAR | Status: AC
  Filled 2014-05-22: qty 2

## 2014-05-22 MED ORDER — NEOSTIGMINE METHYLSULFATE 10 MG/10ML IV SOLN
INTRAVENOUS | Status: DC | PRN
Start: 2014-05-22 — End: 2014-05-22
  Administered 2014-05-22: 2 mg via INTRAVENOUS

## 2014-05-22 MED ORDER — ARTIFICIAL TEARS OP OINT
TOPICAL_OINTMENT | OPHTHALMIC | Status: DC | PRN
  Administered 2014-05-22: 1 via OPHTHALMIC

## 2014-05-22 MED ORDER — GLYCOPYRROLATE 0.2 MG/ML IJ SOLN
INTRAMUSCULAR | Status: DC | PRN
  Administered 2014-05-22: .2 mg via INTRAVENOUS

## 2014-05-22 MED ORDER — GLUCAGON HCL RDNA (DIAGNOSTIC) 1 MG IJ SOLR
INTRAMUSCULAR | Status: DC | PRN
  Administered 2014-05-22: 1 mg via INTRAVENOUS

## 2014-05-22 MED ORDER — MIDAZOLAM HCL 5 MG/5ML IJ SOLN
INTRAMUSCULAR | Status: DC | PRN
  Administered 2014-05-22: .5 mg via INTRAVENOUS

## 2014-05-22 MED ORDER — CIPROFLOXACIN IN D5W 400 MG/200ML IV SOLN
400.0000 mg | Freq: Once | INTRAVENOUS | Status: AC
  Administered 2014-05-22: 400 mg via INTRAVENOUS

## 2014-05-22 MED ORDER — ROCURONIUM BROMIDE 100 MG/10ML IV SOLN
INTRAVENOUS | Status: DC | PRN
  Administered 2014-05-22: 30 mg via INTRAVENOUS

## 2014-05-22 MED ORDER — SODIUM CHLORIDE 0.9 % IV SOLN: INTRAVENOUS | Status: DC

## 2014-05-22 NOTE — Anesthesia Postprocedure Evaluation (Signed)
  Anesthesia Post-op Note  Patient: Jeremy Fisher  Procedure(s) Performed: Procedure(s): ENDOSCOPIC RETROGRADE CHOLANGIOPANCREATOGRAPHY (ERCP) (N/A) SPYGLASS CHOLANGIOSCOPY (N/A)  Patient Location: PACU and Endoscopy Unit  Anesthesia Type:General  Level of Consciousness: awake, alert  and oriented  Airway and Oxygen Therapy: Patient Spontanous Breathing and Patient connected to nasal cannula oxygen  Post-op Pain: mild  Post-op Assessment: Post-op Vital signs reviewed, Patient's Cardiovascular Status Stable, Respiratory Function Stable, Patent Airway and No signs of Nausea or vomiting  Post-op Vital Signs: Reviewed and stable  Last Vitals:  Filed Vitals:   05/22/14 1328  BP: 136/75  Pulse: 56  Temp:   Resp: 11    Complications: No apparent anesthesia complications

## 2014-05-22 NOTE — Transfer of Care (Signed)
Immediate Anesthesia Transfer of Care Note  Patient: Jeremy Fisher  Procedure(s) Performed: Procedure(s): ENDOSCOPIC RETROGRADE CHOLANGIOPANCREATOGRAPHY (ERCP) (N/A) SPYGLASS CHOLANGIOSCOPY (N/A)  Patient Location: PACU  Anesthesia Type:General  Level of Consciousness: awake, alert  and patient cooperative  Airway & Oxygen Therapy: Patient Spontanous Breathing and Patient connected to nasal cannula oxygen  Post-op Assessment: Report given to RN, Post -op Vital signs reviewed and stable and Patient moving all extremities  Post vital signs: Reviewed and stable  Last Vitals:  Filed Vitals:   05/22/14 1355  BP:   Pulse: 52  Temp:   Resp: 16    Complications: No apparent anesthesia complications

## 2014-05-22 NOTE — H&P (Addendum)
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  One year ago he underwent laparoscopic cholecystectomy owing an acute bout of gallstone pancreatitis..  Questionable bile duct stone was seen on his IOC.  Approximately 5 days ago he developed moderately severe periumbilical pain.  LFTs were cholestatic and amylase was elevated.  Abdominal pain subsequently subsided.  Ultrasound demonstrated multiple bile duct stones and dilation of the biliary system.  LFTs remain elevated.  On February 16th, 2016 alkaline phosphatase 256 and total bilirubin 6.9.  He has noted more minor episodes of pain over the past year.   Past Medical History  Diagnosis Date  . Pulmonary embolism   . History of positive PPD   . Pancreatitis    Past Surgical History  Procedure Laterality Date  . Hernia repair Left     right inguinal  . Cholecystectomy N/A 06/17/2013    Procedure: LAPAROSCOPIC CHOLECYSTECTOMY WITH INTRAOPERATIVE CHOLANGIOGRAM;  Surgeon: Mariella Saa, MD;  Location: [facility];  Service: General;  Laterality: N/A;   family history is not on file. Current Facility-Administered Medications  Medication Dose Route Frequency Provider Last Rate Last Dose  . 0.9 %  sodium chloride infusion   Intravenous Continuous Louis Meckel, MD      . indomethacin (INDOCIN) 50 MG suppository 50 mg  50 mg Rectal Once Louis Meckel, MD       Allergies as of 05/21/2014 - Review Complete 05/21/2014  Allergen Reaction Noted  . Primaxin [imipenem] Other (See Comments) 06/05/2013    reports that he has never smoked. He has never used smokeless tobacco. He reports that he drinks alcohol. He reports that he does not use illicit drugs.   Review of Systems: Pertinent positive and negative review of systems were noted in the above HPI section. All other  review of systems were otherwise negative.  Vital signs were reviewed in today's medical record Physical Exam: General: Well developed , well nourished, no acute distress Skin: anicteric Head: Normocephalic and atraumatic Eyes:  sclerae icteric, EOMI Ears: Normal auditory acuity Mouth: No deformity or lesions Neck: Supple, no masses or thyromegaly Lungs: Clear throughout to auscultation Heart: Regular rate and rhythm; no murmurs, rubs or bruits Abdomen: Soft, non tender and non distended. No masses, hepatosplenomegaly or hernias noted. Normal Bowel sounds Rectal:deferred Musculoskeletal: Symmetrical with no gross deformities  Skin: No lesions on visible extremities Pulses:  Normal pulses noted Extremities: No clubbing, cyanosis, edema or deformities noted Neurological: Alert oriented x 4, grossly nonfocal Cervical Nodes:  No significant cervical adenopathy Inguinal Nodes: No significant inguinal adenopathy Psychological:  Alert and cooperative. Normal mood and affect  Impression - choledocholithiasis  Recommendations - ERCP  The risks, benefits, and possible complications of the procedure, including  But not limited to bleeding, perforation, surgery, and the 5-10% risk for pancreatitis, were explained to the patient.  It was explained that  post ERCP pancreatitis which, while usually is mild, occasionally maybe severe and even life-threatening.  Patient's questions were answered.

## 2014-05-22 NOTE — Op Note (Signed)
[facility] [address] [place], [address]   ERCP PROCEDURE REPORT        EXAM DATE: 2014-05-28  PATIENT NAME:          Jeremy Fisher, Jeremy Fisher          MR #: [id number] BIRTHDATE:       02/01/1941     VISIT #:     [id number] ATTENDING:     Louis Meckel, MD     STATUS:     inpatient ASSISTANT:      Harrington Challenger and Dwain Sarna  INDICATIONS:  The patient is a 74 yr old male here for an ERCP due to PROCEDURE PERFORMED:     ERCP with sphincterotomy/papillotomy ERCP with removal of calculus/calculi Endoscopic catheterization of biliary duct MEDICATIONS:     Cipro 400 mg IV  CONSENT: The patient understands the risks and benefits of the procedure and understands that these risks include, but are not limited to: sedation, allergic reaction, infection, perforation and/or bleeding. Alternative means of evaluation and treatment include, among others: physical exam, x-rays, and/or surgical intervention. The patient elects to proceed with this endoscopic procedure.  DESCRIPTION OF PROCEDURE: During intra-op preparation period all mechanical & medical equipment was checked for proper function. Hand hygiene and appropriate measures for infection prevention was taken. After the risks, benefits and alternatives of the procedure were thoroughly explained, Informed was verified, confirmed and timeout was successfully executed by the treatment team. With the patient in left semi-prone position, medications were administered intravenously.The    was passed from the mouth into the esophagus and further advanced from the esophagus into the stomach. From stomach scope was directed to the   .  Major papilla was aligned with the duodenoscope. The scope position was confirmed fluoroscopically. Rest of the findings/therapeutics are given below. The scope was then completely withdrawn from the patient and the procedure completed. The pulse, BP, and O2 saturation  were monitored and documented by the physician and the nursing staff throughout the entire procedure. The patient was cared for as planned according to standard protocol. The patient was then discharged to recovery in stable condition and with appropriate post procedure care.  The common bile duct was cannulated with a 0.35 mm guidewire. Injection demonstrated a diffusely out of the duct with at least one large filling defect area. A 15 mm sphincterotomy was made.  There was a minimal amount of bleeding. A 15 then 18 mm balloon stone extractor was inserted into the bile duct, inflated and swept down and across the sphincterotomized papilla.  Multiple large bile duct stones were delivered into the duodenum.  Final occlusion cholangiogram with the 18 mm balloon stone extractor demonstrated a duct free of stones with excellent drainage into the duodenum.  The bile duct was directly cannulated with a cholangioscope and examined from the proximal portion of the bile duct in the area the bifurcation down through the sphincterotomy papilla.  No stones were seen.    ADVERSE EVENT:  IMPRESSIONS:     choledocholithiasis?"status post sphincterotomy and stone extraction  RECOMMENDATIONS:     full liquid diet and advance as tolerated  REPEAT EXAM:   ___________________________________ Louis Meckel, MD eSigned:  Louis Meckel, MD 2014/05/28 1:22 PM   cc:  CPT CODES: ICD9 CODES:  The ICD and CPT codes recommended by this software are interpretations from the data that the clinical staff has captured with the software.  The verification of the translation  of this report to the ICD and CPT codes and modifiers is the sole responsibility of the health care institution and practicing physician where this report was generated.  [employer] will not be held responsible for the validity of the ICD and CPT codes included on this report.  AMA assumes no liability  for data contained or not contained herein. CPT is a Publishing rights manager of the [employer].   PATIENT NAME:  Jeremy Fisher, Jeremy Fisher MR#: [id number]

## 2014-05-22 NOTE — Discharge Instructions (Signed)
Endoscopic Retrograde Cholangiopancreatography (ERCP), Care After °Refer to this sheet in the next few weeks. These instructions provide you with information on caring for yourself after your procedure. Your health care provider may also give you more specific instructions. Your treatment has been planned according to current medical practices, but problems sometimes occur. Call your health care provider if you have any problems or questions after your procedure.  °WHAT TO EXPECT AFTER THE PROCEDURE  °After your procedure, it is typical to feel:  °· Soreness in your throat.   °· Sick to your stomach (nauseous).   °· Bloated. °· Dizzy.   °· Fatigued. °HOME CARE INSTRUCTIONS °· Have a friend or family member stay with you for the first 24 hours after your procedure. °· Start taking your usual medicines and eating normally as soon as you feel well enough to do so or as directed by your health care provider. °SEEK MEDICAL CARE IF: °· You have abdominal pain.   °· You develop signs of infection, such as:   °¨ Chills.   °¨ Feeling unwell.   °SEEK IMMEDIATE MEDICAL CARE IF: °· You have difficulty swallowing. °· You have worsening throat, chest, or abdominal pain. °· You vomit. °· You have bloody or very black stools. °· You have a fever. °Document Released: 01/09/2013 Document Reviewed: 01/09/2013 °ExitCare® Patient Information ©2015 [employer]. This information is not intended to replace advice given to you by your health care provider. Make sure you discuss any questions you have with your health care provider. ° °

## 2014-05-22 NOTE — Anesthesia Preprocedure Evaluation (Addendum)
Anesthesia Evaluation  Patient identified by MRN, date of birth, ID band Patient awake    Reviewed: Allergy & Precautions, NPO status , Patient's Chart, lab work & pertinent test results, reviewed documented beta blocker date and time   Airway Mallampati: II   Neck ROM: Full    Dental  (+) Teeth Intact, Dental Advisory Given   Pulmonary neg pulmonary ROS,  breath sounds clear to auscultation        Cardiovascular Rhythm:Regular  EKG OK   Neuro/Psych negative neurological ROS  negative psych ROS   GI/Hepatic GBD stone suspected, obstructive jaundice, bili 6.3, alk phos 256   Endo/Other  negative endocrine ROS  Renal/GU negative Renal ROS     Musculoskeletal   Abdominal (+)  Abdomen: soft.    Peds  Hematology 16/46   Anesthesia Other Findings   Reproductive/Obstetrics                            Anesthesia Physical Anesthesia Plan  ASA: II  Anesthesia Plan: General   Post-op Pain Management:    Induction: Intravenous  Airway Management Planned: Oral ETT  Additional Equipment:   Intra-op Plan:   Post-operative Plan: Extubation in OR  Informed Consent: I have reviewed the patients History and Physical, chart, labs and discussed the procedure including the risks, benefits and alternatives for the proposed anesthesia with the patient or authorized representative who has indicated his/her understanding and acceptance.     Plan Discussed with:   Anesthesia Plan Comments:         Anesthesia Quick Evaluation

## 2014-05-22 NOTE — Anesthesia Procedure Notes (Signed)
Procedure Name: Intubation Date/Time: 05/22/2014 12:21 PM Performed by: Coralee Rud Pre-anesthesia Checklist: Patient identified, Emergency Drugs available, Suction available and Patient being monitored Patient Re-evaluated:Patient Re-evaluated prior to inductionOxygen Delivery Method: Circle system utilized Preoxygenation: Pre-oxygenation with 100% oxygen Intubation Type: IV induction Ventilation: Mask ventilation without difficulty Laryngoscope Size: Miller and 3 Grade View: Grade I Tube type: Oral Tube size: 7.5 mm Number of attempts: 1 Airway Equipment and Method: Stylet Placement Confirmation: ETT inserted through vocal cords under direct vision,  positive ETCO2 and CO2 detector Secured at: 22 cm Tube secured with: Tape Dental Injury: Teeth and Oropharynx as per pre-operative assessment

## 2014-05-23 ENCOUNTER — Encounter (HOSPITAL_COMMUNITY): Payer: Self-pay | Admitting: Gastroenterology

## 2016-06-27 ENCOUNTER — Encounter: Payer: Self-pay | Admitting: *Deleted

## 2016-06-27 DIAGNOSIS — I340 Nonrheumatic mitral (valve) insufficiency: Secondary | ICD-10-CM

## 2016-06-27 HISTORY — DX: Nonrheumatic mitral (valve) insufficiency: I34.0

## 2016-06-28 ENCOUNTER — Encounter: Payer: Self-pay | Admitting: Thoracic Surgery (Cardiothoracic Vascular Surgery)

## 2016-06-28 ENCOUNTER — Institutional Professional Consult (permissible substitution) (INDEPENDENT_AMBULATORY_CARE_PROVIDER_SITE_OTHER): Payer: Medicare Other | Admitting: Thoracic Surgery (Cardiothoracic Vascular Surgery)

## 2016-06-28 VITALS — BP 153/84 | HR 64 | Resp 16 | Ht 69.0 in | Wt 176.0 lb

## 2016-06-28 DIAGNOSIS — I340 Nonrheumatic mitral (valve) insufficiency: Secondary | ICD-10-CM | POA: Diagnosis not present

## 2016-06-28 NOTE — Patient Instructions (Signed)
Continue all previous medications without any changes at this time  

## 2016-06-28 NOTE — Progress Notes (Signed)
[address]       [place] [address]             [phone]     CARDIOTHORACIC SURGERY CONSULTATION REPORT  Referring Provider is Quinterious Walraven. Lucretia Roers, MD [facility]) PCP is [name], MD [facility])   Chief Complaint  Patient presents with  . 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  He suffered a pulmonary embolism approximately 10 years ago and was treated with warfarin anticoagulation for approximately 5 years. He underwent an extensive hematologic workup that was essentially unrevealing.  In 2015 he presented with gallstone pancreatitis. He underwent laparoscopic cholecystectomy and later underwent ERCP and recovered uneventfully.  He has otherwise been remarkably healthy all of his adult life. He denies any history of hypertension, hyperlipidemia, diabetes mellitus, palpitations, or previous history of heart murmur. He was recently evaluated at the [facility] to establish primary care because he is a service connected veteran having served in [place].  He was noted to have a prominent murmur on physical exam and a transthoracic echocardiogram was performed revealing the presence of mitral valve prolapse with a large flail segment of the posterior leaflet of the mitral valve and severe mitral regurgitation. The patient has been referred for elective surgical consultation.  The patient is married and lives most of time locally with his wife in [place]. They also have a second home in [place] near the [place] where they spend a fair amount of time, particularly during summer months. The patient has been physically active and  without any significant physical limitations all of his life. He does not exercise on a regular basis but he enjoys woodworking and keeps himself very busy with a variety of different jobs. He states that he has recently noted the development of mild exertional shortness of breath. He states that he only appreciated this when he walked up a flight of stairs recently. He only gets short of breath if he is pushing himself, such as going up 2 or 3 flights of stairs. He experiences no shortness of breath with ordinary activities around the house. He has never had any chest pain or chest tightness. He denies any palpitations, dizzy spells, or syncope.  Past Medical History:  Diagnosis Date  . Gallstone pancreatitis 2015  . History of positive PPD   . Mitral valve prolapse   . Pulmonary embolism (HCC) 2007  . Severe mitral regurgitation     Past Surgical History:  Procedure Laterality Date  . CHOLECYSTECTOMY N/A 06/17/2013   Procedure: LAPAROSCOPIC CHOLECYSTECTOMY WITH INTRAOPERATIVE CHOLANGIOGRAM;  Surgeon: Mariella Saa, MD;  Location: [facility];  Service: General;  Laterality: N/A;  . ERCP N/A 05/22/2014   Procedure: ENDOSCOPIC RETROGRADE CHOLANGIOPANCREATOGRAPHY (ERCP);  Surgeon: Louis Meckel, MD;  Location: [facility] ENDOSCOPY;  Service: Endoscopy;  Laterality: N/A;  . HERNIA REPAIR Left    right inguinal  . SPYGLASS CHOLANGIOSCOPY N/A 05/22/2014   Procedure: BJYNWGNF CHOLANGIOSCOPY;  Surgeon: Louis Meckel, MD;  Location[facility] ENDOSCOPY;  Service: Endoscopy;  Laterality: N/A;    Family History  Problem Relation Age of Onset  . Pulmonary fibrosis Father   . Heart disease Father  died d/t MI    Social History   Social History  . Marital status: Married    Spouse name: N/A  . Number of children: N/A  . Years of education: N/A   Occupational History  . Not on file.   Social History Main Topics  . Smoking status: Never Smoker  . Smokeless tobacco: Never Used  . Alcohol use Yes       Comment: occasionally  . Drug use: No  . Sexual activity: No   Other Topics Concern  . Not on file   Social History Narrative  . No narrative on file    No current outpatient prescriptions on file.   No current facility-administered medications for this visit.     Allergies  Allergen Reactions  . Primaxin [Imipenem] Other (See Comments)    seizure      Review of Systems:   General:  normal appetite, normal energy, no weight gain, no weight loss, no fever  Cardiac:  no chest pain with exertion, no chest pain at rest, + SOB with strenuous exertion, no resting SOB, no PND, no orthopnea, no palpitations, no arrhythmia, no atrial fibrillation, no LE edema, no dizzy spells, no syncope  Respiratory:  no shortness of breath, no home oxygen, no productive cough, no dry cough, no bronchitis, no wheezing, no hemoptysis, no asthma, no pain with inspiration or cough, no sleep apnea, no CPAP at night  GI:   no difficulty swallowing, no reflux, no frequent heartburn, no hiatal hernia, no abdominal pain, no constipation, no diarrhea, no hematochezia, no hematemesis, no melena  GU:   no dysuria,  no frequency, no urinary tract infection, no hematuria, no enlarged prostate, no kidney stones, no kidney disease  Vascular:  no pain suggestive of claudication, no pain in feet, no leg cramps, no varicose veins, no DVT, no non-healing foot ulcer  Neuro:   no stroke, no TIA's, no seizures, no headaches, no temporary blindness one eye,  no slurred speech, no peripheral neuropathy, no chronic pain, no instability of gait, no memory/cognitive dysfunction  Musculoskeletal: no arthritis, no joint swelling, no myalgias, no difficulty walking, normal mobility   Skin:   no rash, no itching, no skin infections, no pressure sores or ulcerations  Psych:   no anxiety, no depression, no nervousness, no unusual recent stress  Eyes:   no blurry vision, no floaters, no recent vision changes, + wears glasses or  contacts  ENT:   no hearing loss, no loose or painful teeth, no dentures, last saw dentist 3 months ago  Hematologic:  no easy bruising, no abnormal bleeding, no clotting disorder, no frequent epistaxis  Endocrine:  no diabetes, does not check CBG's at home     Physical Exam:   BP (!) 153/84 (BP Location: Right Arm, Patient Position: Sitting, Cuff Size: Large)   Pulse 64   Resp 16   Ht 5\' 9"  (1.753 m)   Wt 176 lb (79.8 kg)   SpO2 98% Comment: ON RA  BMI 25.99 kg/m   General:    well-appearing  HEENT:  Unremarkable   Neck:   no JVD, no bruits, no adenopathy   Chest:   clear to auscultation, symmetrical breath sounds, no wheezes, no rhonchi   CV:   RRR, prominent grade IV/VI holosystolic murmur   Abdomen:  soft, non-tender, no masses   Extremities:  warm, well-perfused, pulses palpable, no LE edema  Rectal/GU  Deferred  Neuro:   Grossly non-focal and symmetrical throughout  Skin:  Clean and dry, no rashes, no breakdown   Diagnostic Tests:  TRANSTHORACIC ECHOCARDIOGRAM  Both the images and report from transthoracic echocardiogram performed 05/31/2016 at the [facility] have been reviewed. Sound transmission and image quality is adequate. The patient has mitral valve prolapse with an obvious flail segment involving the middle scallop of the posterior leaflet of the mitral valve.  There is severe mitral regurgitation which is directed anteriorly around the left atrium. There is severe left atrial enlargement. Left ventricular size and systolic function appears normal. Ejection fraction was visually estimated 60% and calculated 64%.  Right ventricular size and function appeared normal. There was mild central tricuspid regurgitation. RV systolic pressure was estimated 6 mmHg.   Impression:  Patient has mitral valve prolapse with stage D severe symptomatic primary mitral regurgitation.  He reports recent onset of relatively mild symptoms of exertional shortness of breath  consistent with chronic diastolic congestive heart failure, New York Heart Association functional class 1-2.  I have personally reviewed his recent transthoracic echocardiogram. 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 Left ventricular systolic function appears normal. There is severe left atrial enlargement. I agree that the patient should consider proceeding with elective mitral valve repair in the near future.  The patient may be a very good candidate for minimally invasive approach for surgery, although preoperative diagnostic cardiac catheterization will need to be performed.   Plan:  The patient was counseled at length regarding the indications, risks and potential benefits of mitral valve repair.  The rationale for elective surgery has been explained, including a comparison between surgery and continued medical therapy with close follow-up.  The likelihood of successful and durable valve repair has been discussed with particular reference to the findings of their recent echocardiogram.  Based upon these findings and previous experience, I have quoted him a greater than 95 percent likelihood of successful valve repair.  As a next step I favor proceeding with transesophageal echocardiogram and diagnostic cardiac catheterization. Although images from the recent transthoracic echocardiogram were adequate, transesophageal echocardiogram will provide more definitive anatomical evaluation so that we can give her better estimate of the likelihood of successful and durable mitral valve repair.  The patient will need to undergo diagnostic cardiac catheterization to rule out the presence of significant coronary artery disease. The patient desires to discuss matters further with his wife before making a final decision about scheduling surgery. He will be  referred for TEE and diagnostic catheterization. He will contact our office in the near future to make final plans for surgery.   Assuming that he does not have significant coronary artery disease, we will plan CT angiography to further evaluate the feasibility of peripheral arterial cannulation for surgery to allow for a minimally invasive approach.   I spent in excess of 90 minutes during the conduct of this office consultation and >50% of this time involved direct face-to-face encounter with the patient for counseling and/or coordination of their care.    Salvatore Decent. [name], MD 06/28/2016 10:51 AM

## 2016-06-30 ENCOUNTER — Other Ambulatory Visit: Payer: Self-pay | Admitting: *Deleted

## 2016-06-30 DIAGNOSIS — I340 Nonrheumatic mitral (valve) insufficiency: Secondary | ICD-10-CM

## 2016-07-06 ENCOUNTER — Other Ambulatory Visit: Payer: Self-pay | Admitting: *Deleted

## 2016-07-06 ENCOUNTER — Other Ambulatory Visit: Payer: Self-pay

## 2016-07-06 DIAGNOSIS — I340 Nonrheumatic mitral (valve) insufficiency: Secondary | ICD-10-CM

## 2016-07-06 DIAGNOSIS — I7409 Other arterial embolism and thrombosis of abdominal aorta: Secondary | ICD-10-CM

## 2016-07-06 DIAGNOSIS — Z01818 Encounter for other preprocedural examination: Secondary | ICD-10-CM

## 2016-07-07 ENCOUNTER — Other Ambulatory Visit: Payer: Medicare Other

## 2016-07-08 ENCOUNTER — Other Ambulatory Visit: Payer: Medicare Other | Admitting: *Deleted

## 2016-07-08 DIAGNOSIS — I340 Nonrheumatic mitral (valve) insufficiency: Secondary | ICD-10-CM

## 2016-07-08 LAB — CBC
HEMATOCRIT: 41.8 % (ref 37.5–51.0)
Hemoglobin: 15.2 g/dL (ref 13.0–17.7)
MCH: 30 pg (ref 26.6–33.0)
MCHC: 36.4 g/dL — ABNORMAL HIGH (ref 31.5–35.7)
MCV: 82 fL (ref 79–97)
PLATELETS: 163 10*3/uL (ref 150–379)
RBC: 5.07 x10E6/uL (ref 4.14–5.80)
RDW: 13 % (ref 12.3–15.4)
WBC: 5.3 10*3/uL (ref 3.4–10.8)

## 2016-07-08 LAB — BASIC METABOLIC PANEL
BUN / CREAT RATIO: 20 (ref 10–24)
BUN: 17 mg/dL (ref 8–27)
CO2: 21 mmol/L (ref 18–29)
CREATININE: 0.83 mg/dL (ref 0.76–1.27)
Calcium: 9.6 mg/dL (ref 8.6–10.2)
Chloride: 102 mmol/L (ref 96–106)
GFR calc Af Amer: 100 mL/min/{1.73_m2} (ref >59)
GFR, EST NON AFRICAN AMERICAN: 86 mL/min/{1.73_m2} (ref >59)
Glucose: 124 mg/dL — ABNORMAL HIGH (ref 65–99)
Potassium: 3.9 mmol/L (ref 3.5–5.2)
SODIUM: 140 mmol/L (ref 134–144)

## 2016-07-08 LAB — PROTIME-INR
INR: 1 (ref 0.8–1.2)
Prothrombin Time: 10.6 s (ref 9.1–12.0)

## 2016-07-19 ENCOUNTER — Other Ambulatory Visit: Payer: Self-pay | Admitting: Cardiovascular Disease

## 2016-07-19 DIAGNOSIS — I340 Nonrheumatic mitral (valve) insufficiency: Secondary | ICD-10-CM

## 2016-07-20 ENCOUNTER — Encounter (HOSPITAL_COMMUNITY): Payer: Self-pay | Admitting: *Deleted

## 2016-07-20 ENCOUNTER — Telehealth: Payer: Self-pay | Admitting: Thoracic Surgery (Cardiothoracic Vascular Surgery)

## 2016-07-20 ENCOUNTER — Ambulatory Visit (HOSPITAL_BASED_OUTPATIENT_CLINIC_OR_DEPARTMENT_OTHER): Payer: Medicare Other

## 2016-07-20 ENCOUNTER — Ambulatory Visit (HOSPITAL_COMMUNITY)
Admission: RE | Admit: 2016-07-20 | Discharge: 2016-07-20 | Disposition: A | Discharge status: Home / Self Care | Payer: Medicare Other | Source: Ambulatory Visit | Attending: Cardiology | Admitting: Cardiology

## 2016-07-20 ENCOUNTER — Encounter (HOSPITAL_COMMUNITY)
Admission: RE | Disposition: A | Discharge status: Home / Self Care | Payer: Self-pay | Source: Ambulatory Visit | Attending: Cardiology

## 2016-07-20 ENCOUNTER — Encounter: Payer: Self-pay | Admitting: Thoracic Surgery (Cardiothoracic Vascular Surgery)

## 2016-07-20 DIAGNOSIS — I2510 Atherosclerotic heart disease of native coronary artery without angina pectoris: Secondary | ICD-10-CM | POA: Insufficient documentation

## 2016-07-20 DIAGNOSIS — Z8249 Family history of ischemic heart disease and other diseases of the circulatory system: Secondary | ICD-10-CM | POA: Diagnosis not present

## 2016-07-20 DIAGNOSIS — I341 Nonrheumatic mitral (valve) prolapse: Secondary | ICD-10-CM | POA: Diagnosis not present

## 2016-07-20 DIAGNOSIS — R7611 Nonspecific reaction to tuberculin skin test without active tuberculosis: Secondary | ICD-10-CM | POA: Insufficient documentation

## 2016-07-20 DIAGNOSIS — I340 Nonrheumatic mitral (valve) insufficiency: Secondary | ICD-10-CM | POA: Insufficient documentation

## 2016-07-20 DIAGNOSIS — Z86711 Personal history of pulmonary embolism: Secondary | ICD-10-CM | POA: Insufficient documentation

## 2016-07-20 DIAGNOSIS — Q2112 Patent foramen ovale: Secondary | ICD-10-CM

## 2016-07-20 DIAGNOSIS — Q211 Atrial septal defect: Secondary | ICD-10-CM

## 2016-07-20 HISTORY — PX: RIGHT/LEFT HEART CATH AND CORONARY ANGIOGRAPHY: CATH118266

## 2016-07-20 HISTORY — DX: Patent foramen ovale: Q21.12

## 2016-07-20 HISTORY — DX: Atrial septal defect: Q21.1

## 2016-07-20 HISTORY — PX: TEE WITHOUT CARDIOVERSION: SHX5443

## 2016-07-20 LAB — POCT I-STAT 3, ART BLOOD GAS (G3+)
Acid-base deficit: 1 mmol/L (ref 0.0–2.0)
BICARBONATE: 24.2 mmol/L (ref 20.0–28.0)
O2 SAT: 91 %
PH ART: 7.359 (ref 7.350–7.450)
TCO2: 26 mmol/L (ref 0–100)
pCO2 arterial: 43 mmHg (ref 32.0–48.0)
pO2, Arterial: 63 mmHg — ABNORMAL LOW (ref 83.0–108.0)

## 2016-07-20 LAB — POCT I-STAT 3, VENOUS BLOOD GAS (G3P V)
BICARBONATE: 26.2 mmol/L (ref 20.0–28.0)
O2 Saturation: 66 %
PCO2 VEN: 47.4 mmHg (ref 44.0–60.0)
PO2 VEN: 36 mmHg (ref 32.0–45.0)
TCO2: 28 mmol/L (ref 0–100)
pH, Ven: 7.351 (ref 7.250–7.430)

## 2016-07-20 SURGERY — ECHOCARDIOGRAM, TRANSESOPHAGEAL
Anesthesia: Moderate Sedation

## 2016-07-20 SURGERY — RIGHT/LEFT HEART CATH AND CORONARY ANGIOGRAPHY
Anesthesia: LOCAL

## 2016-07-20 MED ORDER — SODIUM CHLORIDE 0.9 % WEIGHT BASED INFUSION: 1.0000 mL/kg/h | INTRAVENOUS | Status: DC

## 2016-07-20 MED ORDER — SODIUM CHLORIDE 0.9 % IV SOLN: INTRAVENOUS | Status: DC

## 2016-07-20 MED ORDER — FENTANYL CITRATE (PF) 100 MCG/2ML IJ SOLN
INTRAMUSCULAR | Status: DC | PRN
  Administered 2016-07-20 (×2): 25 ug via INTRAVENOUS

## 2016-07-20 MED ORDER — LIDOCAINE HCL 1 % IJ SOLN
INTRAMUSCULAR | Status: AC
  Filled 2016-07-20: qty 20

## 2016-07-20 MED ORDER — MIDAZOLAM HCL 10 MG/2ML IJ SOLN
INTRAMUSCULAR | Status: DC | PRN
  Administered 2016-07-20: 2 mg via INTRAVENOUS
  Administered 2016-07-20: 1 mg via INTRAVENOUS

## 2016-07-20 MED ORDER — IOPAMIDOL (ISOVUE-370) INJECTION 76%
INTRAVENOUS | Status: DC | PRN
  Administered 2016-07-20: 45 mL via INTRA_ARTERIAL

## 2016-07-20 MED ORDER — SODIUM CHLORIDE 0.9 % IV SOLN: 250.0000 mL | INTRAVENOUS | Status: DC | PRN

## 2016-07-20 MED ORDER — MIDAZOLAM HCL 2 MG/2ML IJ SOLN
INTRAMUSCULAR | Status: AC
  Filled 2016-07-20: qty 2

## 2016-07-20 MED ORDER — FENTANYL CITRATE (PF) 100 MCG/2ML IJ SOLN
INTRAMUSCULAR | Status: AC
  Filled 2016-07-20: qty 2

## 2016-07-20 MED ORDER — HEPARIN (PORCINE) IN NACL 2-0.9 UNIT/ML-% IJ SOLN
INTRAMUSCULAR | Status: AC
  Filled 2016-07-20: qty 1000

## 2016-07-20 MED ORDER — LIDOCAINE HCL (PF) 1 % IJ SOLN
INTRAMUSCULAR | Status: DC | PRN
  Administered 2016-07-20: 5 mL

## 2016-07-20 MED ORDER — ASPIRIN 81 MG PO CHEW: 81.0000 mg | CHEWABLE_TABLET | ORAL | Status: DC

## 2016-07-20 MED ORDER — FENTANYL CITRATE (PF) 100 MCG/2ML IJ SOLN
INTRAMUSCULAR | Status: DC | PRN
  Administered 2016-07-20: 25 ug via INTRAVENOUS

## 2016-07-20 MED ORDER — SODIUM CHLORIDE 0.9% FLUSH: 3.0000 mL | INTRAVENOUS | Status: DC | PRN

## 2016-07-20 MED ORDER — MIDAZOLAM HCL 2 MG/2ML IJ SOLN
INTRAMUSCULAR | Status: DC | PRN
  Administered 2016-07-20: 2 mg via INTRAVENOUS

## 2016-07-20 MED ORDER — ACETAMINOPHEN 325 MG PO TABS: 650.0000 mg | ORAL_TABLET | ORAL | Status: DC | PRN

## 2016-07-20 MED ORDER — HEPARIN (PORCINE) IN NACL 2-0.9 UNIT/ML-% IJ SOLN
INTRAMUSCULAR | Status: DC | PRN
  Administered 2016-07-20: 1000 mL

## 2016-07-20 MED ORDER — HEPARIN SODIUM (PORCINE) 1000 UNIT/ML IJ SOLN
INTRAMUSCULAR | Status: DC | PRN
  Administered 2016-07-20: 4000 [IU] via INTRAVENOUS

## 2016-07-20 MED ORDER — VERAPAMIL HCL 2.5 MG/ML IV SOLN
INTRAVENOUS | Status: DC | PRN
  Administered 2016-07-20: 10 mL via INTRA_ARTERIAL

## 2016-07-20 MED ORDER — AMIODARONE HCL 200 MG PO TABS: 200.0000 mg | ORAL_TABLET | Freq: Every day | ORAL | 0 refills | Status: DC

## 2016-07-20 MED ORDER — SODIUM CHLORIDE 0.9 % WEIGHT BASED INFUSION: 3.0000 mL/kg/h | INTRAVENOUS | Status: AC

## 2016-07-20 MED ORDER — VERAPAMIL HCL 2.5 MG/ML IV SOLN
INTRAVENOUS | Status: AC
  Filled 2016-07-20: qty 2

## 2016-07-20 MED ORDER — SODIUM CHLORIDE 0.9% FLUSH: 3.0000 mL | Freq: Two times a day (BID) | INTRAVENOUS | Status: DC

## 2016-07-20 MED ORDER — NITROGLYCERIN 1 MG/10 ML FOR IR/CATH LAB
INTRA_ARTERIAL | Status: AC
  Filled 2016-07-20: qty 10

## 2016-07-20 MED ORDER — ONDANSETRON HCL 4 MG/2ML IJ SOLN: 4.0000 mg | Freq: Four times a day (QID) | INTRAMUSCULAR | Status: DC | PRN

## 2016-07-20 MED ORDER — MIDAZOLAM HCL 5 MG/ML IJ SOLN
INTRAMUSCULAR | Status: AC
  Filled 2016-07-20: qty 2

## 2016-07-20 MED ORDER — HEPARIN SODIUM (PORCINE) 1000 UNIT/ML IJ SOLN
INTRAMUSCULAR | Status: AC
  Filled 2016-07-20: qty 1

## 2016-07-20 SURGICAL SUPPLY — 11 items
CATH 5FR JL3.5 JR4 ANG PIG MP (CATHETERS) ×2 IMPLANT
CATH BALLN WEDGE 5F 110CM (CATHETERS) ×2 IMPLANT
DEVICE RAD COMP TR BAND LRG (VASCULAR PRODUCTS) ×4 IMPLANT
GLIDESHEATH SLEND SS 6F .021 (SHEATH) ×2 IMPLANT
GUIDEWIRE INQWIRE 1.5J.035X260 (WIRE) ×1 IMPLANT
INQWIRE 1.5J .035X260CM (WIRE) ×2
KIT HEART LEFT (KITS) ×2 IMPLANT
PACK CARDIAC CATHETERIZATION (CUSTOM PROCEDURE TRAY) ×2 IMPLANT
SHEATH GLIDE SLENDER 4/5FR (SHEATH) ×2 IMPLANT
TRANSDUCER W/STOPCOCK (MISCELLANEOUS) ×2 IMPLANT
TUBING CIL FLEX 10 FLL-RA (TUBING) ×2 IMPLANT

## 2016-07-20 NOTE — Progress Notes (Signed)
Right brachial sheath removed, pressure held for 10 minutes site, gauze with coband placed, site WNL

## 2016-07-20 NOTE — CV Procedure (Signed)
Procedure: TEE  Indication: Mitral valve prolapse.  Sedation: Versed 3 mg IV, Fentanyl 50 mcg IV  Findings: Please see echo section for full report.  Mildly dilated left ventricle with normal wall thickness.  Normal LV systolic function, EF 60-65% with normal wall motion. Normal right ventricular size and systolic function.  Normal right atrial size.  Moderately dilated left atrium.  No LA appendage thrombus.  There is a small left to right shunt via the interatrial septum.  I think this is probably a very small secundum ASD based on positioning (rather than PFO).  Trileaflet aortic valve with no significant regurgitation, no stenosis.  There is prolapse of the tricuspid valve with only trivial TR.  There is a myxomatous-appearing mitral valve with partial flail P2 segment.  I suspect that a ruptured chord is present.  Very eccentric, anteriorly-directed mitral regurgitation is severe.  Unable to do PISA because so eccentric.  Normal caliber aorta with minimal plaque.    Impression:  1. Preserved LV EF with mildly dilated LV.  2. 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  3. Suspect very small secundum ASD.   Marca Ancona 07/20/2016 9:51 AM

## 2016-07-20 NOTE — Interval H&P Note (Signed)
History and Physical Interval Note:  07/20/2016 10:43 AM  Jeremy Fisher  has presented today for surgery, with the diagnosis of SOB, Mitral Regurgitation  The various methods of treatment have been discussed with the patient and family. After consideration of risks, benefits and other options for treatment, the patient has consented to  Procedure(s): Right/Left Heart Cath and Coronary Angiography (N/A) as a surgical intervention .  The patient's history has been reviewed, patient examined, no change in status, stable for surgery.  I have reviewed the patient's chart and labs.  Questions were answered to the patient's satisfaction.     Tonny Bollman

## 2016-07-20 NOTE — Progress Notes (Signed)
TRB x2  removed site WNL

## 2016-07-20 NOTE — Interval H&P Note (Signed)
History and Physical Interval Note:  07/20/2016 9:26 AM  Jeremy Fisher  has presented today for surgery, with the diagnosis of shortness of breathe, mitral regurgitation  The various methods of treatment have been discussed with the patient and family. After consideration of risks, benefits and other options for treatment, the patient has consented to  Procedure(s): TRANSESOPHAGEAL ECHOCARDIOGRAM (TEE) (N/A) as a surgical intervention .  The patient's history has been reviewed, patient examined, no change in status, stable for surgery.  I have reviewed the patient's chart and labs.  Questions were answered to the patient's satisfaction.     Jackolyn Geron [employer]

## 2016-07-20 NOTE — Telephone Encounter (Signed)
Called patient to discuss results of cath and TEE performed earlier today.  All questions answered.  Will start low dose amiodarone tomorrow to decrease risks of perioperative atrial dysrhythmias.  Purcell Nails, MD 07/20/2016 3:57 PM

## 2016-07-20 NOTE — H&P (View-Only) (Signed)
[address]       [place] [address]             [phone]     CARDIOTHORACIC SURGERY CONSULTATION REPORT  Referring Provider is Quinterious Walraven. Lucretia Roers, MD [facility]) PCP is [name], MD [facility])   Chief Complaint  Patient presents with  . 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  He suffered a pulmonary embolism approximately 10 years ago and was treated with warfarin anticoagulation for approximately 5 years. He underwent an extensive hematologic workup that was essentially unrevealing.  In 2015 he presented with gallstone pancreatitis. He underwent laparoscopic cholecystectomy and later underwent ERCP and recovered uneventfully.  He has otherwise been remarkably healthy all of his adult life. He denies any history of hypertension, hyperlipidemia, diabetes mellitus, palpitations, or previous history of heart murmur. He was recently evaluated at the [facility] to establish primary care because he is a service connected veteran having served in [place].  He was noted to have a prominent murmur on physical exam and a transthoracic echocardiogram was performed revealing the presence of mitral valve prolapse with a large flail segment of the posterior leaflet of the mitral valve and severe mitral regurgitation. The patient has been referred for elective surgical consultation.  The patient is married and lives most of time locally with his wife in [place]. They also have a second home in [place] near the [place] where they spend a fair amount of time, particularly during summer months. The patient has been physically active and  without any significant physical limitations all of his life. He does not exercise on a regular basis but he enjoys woodworking and keeps himself very busy with a variety of different jobs. He states that he has recently noted the development of mild exertional shortness of breath. He states that he only appreciated this when he walked up a flight of stairs recently. He only gets short of breath if he is pushing himself, such as going up 2 or 3 flights of stairs. He experiences no shortness of breath with ordinary activities around the house. He has never had any chest pain or chest tightness. He denies any palpitations, dizzy spells, or syncope.  Past Medical History:  Diagnosis Date  . Gallstone pancreatitis 2015  . History of positive PPD   . Mitral valve prolapse   . Pulmonary embolism (HCC) 2007  . Severe mitral regurgitation     Past Surgical History:  Procedure Laterality Date  . CHOLECYSTECTOMY N/A 06/17/2013   Procedure: LAPAROSCOPIC CHOLECYSTECTOMY WITH INTRAOPERATIVE CHOLANGIOGRAM;  Surgeon: Mariella Saa, MD;  Location: [facility] ORS;  Service: General;  Laterality: N/A;  . ERCP N/A 05/22/2014   Procedure: ENDOSCOPIC RETROGRADE CHOLANGIOPANCREATOGRAPHY (ERCP);  Surgeon: Louis Meckel, MD;  Location: [facility] ENDOSCOPY;  Service: Endoscopy;  Laterality: N/A;  . HERNIA REPAIR Left    right inguinal  . SPYGLASS CHOLANGIOSCOPY N/A 05/22/2014   Procedure: BJYNWGNF CHOLANGIOSCOPY;  Surgeon: Louis Meckel, MD;  Location[facility] ENDOSCOPY;  Service: Endoscopy;  Laterality: N/A;    Family History  Problem Relation Age of Onset  . Pulmonary fibrosis Father   . Heart disease Father  died d/t MI    Social History   Social History  . Marital status: Married    Spouse name: N/A  . Number of children: N/A  . Years of education: N/A   Occupational History  . Not on file.   Social History Main Topics  . Smoking status: Never Smoker  . Smokeless tobacco: Never Used  . Alcohol use Yes       Comment: occasionally  . Drug use: No  . Sexual activity: No   Other Topics Concern  . Not on file   Social History Narrative  . No narrative on file    No current outpatient prescriptions on file.   No current facility-administered medications for this visit.     Allergies  Allergen Reactions  . Primaxin [Imipenem] Other (See Comments)    seizure      Review of Systems:   General:  normal appetite, normal energy, no weight gain, no weight loss, no fever  Cardiac:  no chest pain with exertion, no chest pain at rest, + SOB with strenuous exertion, no resting SOB, no PND, no orthopnea, no palpitations, no arrhythmia, no atrial fibrillation, no LE edema, no dizzy spells, no syncope  Respiratory:  no shortness of breath, no home oxygen, no productive cough, no dry cough, no bronchitis, no wheezing, no hemoptysis, no asthma, no pain with inspiration or cough, no sleep apnea, no CPAP at night  GI:   no difficulty swallowing, no reflux, no frequent heartburn, no hiatal hernia, no abdominal pain, no constipation, no diarrhea, no hematochezia, no hematemesis, no melena  GU:   no dysuria,  no frequency, no urinary tract infection, no hematuria, no enlarged prostate, no kidney stones, no kidney disease  Vascular:  no pain suggestive of claudication, no pain in feet, no leg cramps, no varicose veins, no DVT, no non-healing foot ulcer  Neuro:   no stroke, no TIA's, no seizures, no headaches, no temporary blindness one eye,  no slurred speech, no peripheral neuropathy, no chronic pain, no instability of gait, no memory/cognitive dysfunction  Musculoskeletal: no arthritis, no joint swelling, no myalgias, no difficulty walking, normal mobility   Skin:   no rash, no itching, no skin infections, no pressure sores or ulcerations  Psych:   no anxiety, no depression, no nervousness, no unusual recent stress  Eyes:   no blurry vision, no floaters, no recent vision changes, + wears glasses or  contacts  ENT:   no hearing loss, no loose or painful teeth, no dentures, last saw dentist 3 months ago  Hematologic:  no easy bruising, no abnormal bleeding, no clotting disorder, no frequent epistaxis  Endocrine:  no diabetes, does not check CBG's at home     Physical Exam:   BP (!) 153/84 (BP Location: Right Arm, Patient Position: Sitting, Cuff Size: Large)   Pulse 64   Resp 16   Ht 5\' 9"  (1.753 m)   Wt 176 lb (79.8 kg)   SpO2 98% Comment: ON RA  BMI 25.99 kg/m   General:    well-appearing  HEENT:  Unremarkable   Neck:   no JVD, no bruits, no adenopathy   Chest:   clear to auscultation, symmetrical breath sounds, no wheezes, no rhonchi   CV:   RRR, prominent grade IV/VI holosystolic murmur   Abdomen:  soft, non-tender, no masses   Extremities:  warm, well-perfused, pulses palpable, no LE edema  Rectal/GU  Deferred  Neuro:   Grossly non-focal and symmetrical throughout  Skin:  Clean and dry, no rashes, no breakdown   Diagnostic Tests:  TRANSTHORACIC ECHOCARDIOGRAM  Both the images and report from transthoracic echocardiogram performed 05/31/2016 at the [facility] have been reviewed. Sound transmission and image quality is adequate. The patient has mitral valve prolapse with an obvious flail segment involving the middle scallop of the posterior leaflet of the mitral valve.  There is severe mitral regurgitation which is directed anteriorly around the left atrium. There is severe left atrial enlargement. Left ventricular size and systolic function appears normal. Ejection fraction was visually estimated 60% and calculated 64%.  Right ventricular size and function appeared normal. There was mild central tricuspid regurgitation. RV systolic pressure was estimated 6 mmHg.   Impression:  Patient has mitral valve prolapse with stage D severe symptomatic primary mitral regurgitation.  He reports recent onset of relatively mild symptoms of exertional shortness of breath  consistent with chronic diastolic congestive heart failure, New York Heart Association functional class 1-2.  I have personally reviewed his recent transthoracic echocardiogram. 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 Left ventricular systolic function appears normal. There is severe left atrial enlargement. I agree that the patient should consider proceeding with elective mitral valve repair in the near future.  The patient may be a very good candidate for minimally invasive approach for surgery, although preoperative diagnostic cardiac catheterization will need to be performed.   Plan:  The patient was counseled at length regarding the indications, risks and potential benefits of mitral valve repair.  The rationale for elective surgery has been explained, including a comparison between surgery and continued medical therapy with close follow-up.  The likelihood of successful and durable valve repair has been discussed with particular reference to the findings of their recent echocardiogram.  Based upon these findings and previous experience, I have quoted him a greater than 95 percent likelihood of successful valve repair.  As a next step I favor proceeding with transesophageal echocardiogram and diagnostic cardiac catheterization. Although images from the recent transthoracic echocardiogram were adequate, transesophageal echocardiogram will provide more definitive anatomical evaluation so that we can give her better estimate of the likelihood of successful and durable mitral valve repair.  The patient will need to undergo diagnostic cardiac catheterization to rule out the presence of significant coronary artery disease. The patient desires to discuss matters further with his wife before making a final decision about scheduling surgery. He will be  referred for TEE and diagnostic catheterization. He will contact our office in the near future to make final plans for surgery.   Assuming that he does not have significant coronary artery disease, we will plan CT angiography to further evaluate the feasibility of peripheral arterial cannulation for surgery to allow for a minimally invasive approach.   I spent in excess of 90 minutes during the conduct of this office consultation and >50% of this time involved direct face-to-face encounter with the patient for counseling and/or coordination of their care.    Salvatore Decent. [name], MD 06/28/2016 10:51 AM

## 2016-07-20 NOTE — Discharge Instructions (Addendum)
Radial Site Care Refer to this sheet in the next few weeks. These instructions provide you with information about caring for yourself after your procedure. Your health care provider may also give you more specific instructions. Your treatment has been planned according to current medical practices, but problems sometimes occur. Call your health care provider if you have any problems or questions after your procedure. What can I expect after the procedure? After your procedure, it is typical to have the following:  Bruising at the radial site that usually fades within 1-2 weeks.  Blood collecting in the tissue (hematoma) that may be painful to the touch. It should usually decrease in size and tenderness within 1-2 weeks. Follow these instructions at home:  Take medicines only as directed by your health care provider.  You may shower 24-48 hours after the procedure or as directed by your health care provider. Remove the bandage (dressing) and gently wash the site with plain soap and water. Pat the area dry with a clean towel. Do not rub the site, because this may cause bleeding.  Do not take baths, swim, or use a hot tub until your health care provider approves.  Check your insertion site every day for redness, swelling, or drainage.  Do not apply powder or lotion to the site.  Do not flex or bend the affected arm for 24 hours or as directed by your health care provider.  Do not push or pull heavy objects with the affected arm for 24 hours or as directed by your health care provider.  Do not lift over 10 lb (4.5 kg) for 5 days after your procedure or as directed by your health care provider.  Ask your health care provider when it is okay to:  Return to work or school.  Resume usual physical activities or sports.  Resume sexual activity.  Do not drive home if you are discharged the same day as the procedure. Have someone else drive you.  You may drive 24 hours after the procedure  unless otherwise instructed by your health care provider.  Do not operate machinery or power tools for 24 hours after the procedure.  If your procedure was done as an outpatient procedure, which means that you went home the same day as your procedure, a responsible adult should be with you for the first 24 hours after you arrive home.  Keep all follow-up visits as directed by your health care provider. This is important. Contact a health care provider if:  You have a fever.  You have chills.  You have increased bleeding from the radial site. Hold pressure on the site. Get help right away if:  You have unusual pain at the radial site.  You have redness, warmth, or swelling at the radial site.  You have drainage (other than a small amount of blood on the dressing) from the radial site.  The radial site is bleeding, and the bleeding does not stop after 30 minutes of holding steady pressure on the site.  Your arm or hand becomes pale, cool, tingly, or numb. This information is not intended to replace advice given to you by your health care provider. Make sure you discuss any questions you have with your health care provider. Document Released: 04/23/2010 Document Revised: 08/27/2015 Document Reviewed: 10/07/2013 Elsevier Interactive Patient Education  2017 Elsevier Inc.   Transesophageal Echocardiogram Transesophageal echocardiography (TEE) is a special type of test that produces images of the heart by using sound waves (echocardiogram). This type of  echocardiography can obtain better images of the heart than standard echocardiography. TEE is done by passing a flexible tube down the esophagus. The heart is located in front of the esophagus. Because the heart and esophagus are close to one another, your health care provider can take very clear, detailed pictures of the heart via ultrasound waves. TEE may be done:  If your health care provider needs more information based on standard  echocardiography findings.  If you had a stroke. This might have happened because a clot formed in your heart. TEE can visualize different areas of the heart and check for clots.  To check valve anatomy and function.  To check for infection on the inside of your heart (endocarditis).  To evaluate the dividing wall (septum) of the heart and presence of a hole that did not close after birth (patent foramen ovale or atrial septal defect).  To help diagnose a tear in the wall of the aorta (aortic dissection).  During cardiac valve surgery. This allows the surgeon to assess the valve repair before closing the chest.  During a variety of other cardiac procedures to guide positioning of catheters.  Sometimes before a cardioversion, which is a shock to convert heart rhythm back to normal. Tell a health care provider about:  Any allergies you have.  All medicines you are taking, including vitamins, herbs, eye drops, creams, and over-the-counter medicines.  Any problems you or family members have had with anesthetic medicines.  Any blood disorders you have.  Any surgeries you have had.  Any medical conditions you have.  Swallowing difficulties.  An esophageal obstruction. What are the risks? Generally, TEE is a safe procedure. However, as with any procedure, complications can occur. Possible complications include an esophageal tear (rupture). What happens before the procedure?  Do not eat or drink for 6 hours before the procedure or as directed by your health care provider.  Arrange for someone to drive you home after the procedure. Do not drive yourself home. During the procedure, you will be given medicines that can continue to make you feel drowsy and can impair your reflexes.  An IV access tube will be started in the arm. What happens during the procedure?  A medicine to help you relax (sedative) will be given through the IV access tube.  A medicine may be sprayed or gargled  to numb the back of the throat.  Your blood pressure, heart rate, and breathing (vital signs) will be monitored during the procedure.  The TEE probe is a long, flexible tube. The tip of the probe is placed into the back of the mouth, and you will be asked to swallow. This helps to pass the tip of the probe into the esophagus. Once the tip of the probe is in the correct area, your health care provider can take pictures of the heart.  TEE is usually not a painful procedure. You may feel the probe press against the back of the throat. The probe does not enter the trachea and does not affect your breathing. What happens after the procedure?  You will be in bed, resting, until you have fully returned to consciousness.  When you first awaken, your throat may feel slightly sore and will probably still feel numb. This will improve slowly over time.  You will not be allowed to eat or drink until it is clear that the numbness has improved.  Once you have been able to drink, urinate, and sit on the edge of the bed  without feeling sick to your stomach (nausea) or dizzy, you may be cleared to go home.  You should have a friend or family member with you for the next 24 hours after your procedure. This information is not intended to replace advice given to you by your health care provider. Make sure you discuss any questions you have with your health care provider. Document Released: 06/11/2002 Document Revised: 08/27/2015 Document Reviewed: 09/20/2012 Elsevier Interactive Patient Education  2017 [employer].

## 2016-07-20 NOTE — Interval H&P Note (Signed)
History and Physical Interval Note:  07/20/2016 10:55 AM  Jeremy Fisher  has presented today for surgery, with the diagnosis of SOB, Mitral Regurgitation  The various methods of treatment have been discussed with the patient and family. After consideration of risks, benefits and other options for treatment, the patient has consented to  Procedure(s): Right/Left Heart Cath and Coronary Angiography (N/A) as a surgical intervention .  The patient's history has been reviewed, patient examined, no change in status, stable for surgery.  I have reviewed the patient's chart and labs.  Questions were answered to the patient's satisfaction.     Tonny Bollman

## 2016-07-21 ENCOUNTER — Other Ambulatory Visit: Payer: Self-pay | Admitting: *Deleted

## 2016-07-21 DIAGNOSIS — I340 Nonrheumatic mitral (valve) insufficiency: Secondary | ICD-10-CM

## 2016-07-21 MED FILL — Nitroglycerin IV Soln 100 MCG/ML in D5W: INTRA_ARTERIAL | Qty: 10 | Status: AC

## 2016-07-21 MED FILL — Lidocaine HCl Local Inj 1%: INTRAMUSCULAR | Qty: 20 | Status: AC

## 2016-07-22 ENCOUNTER — Ambulatory Visit
Admission: RE | Admit: 2016-07-22 | Discharge: 2016-07-22 | Disposition: A | Discharge status: Home / Self Care | Payer: Medicare Other | Source: Ambulatory Visit | Attending: Thoracic Surgery (Cardiothoracic Vascular Surgery) | Admitting: Thoracic Surgery (Cardiothoracic Vascular Surgery)

## 2016-07-22 DIAGNOSIS — R0602 Shortness of breath: Secondary | ICD-10-CM | POA: Diagnosis not present

## 2016-07-22 DIAGNOSIS — I340 Nonrheumatic mitral (valve) insufficiency: Secondary | ICD-10-CM

## 2016-07-22 DIAGNOSIS — Z01818 Encounter for other preprocedural examination: Secondary | ICD-10-CM

## 2016-07-22 DIAGNOSIS — I7409 Other arterial embolism and thrombosis of abdominal aorta: Secondary | ICD-10-CM

## 2016-07-22 DIAGNOSIS — I700 Atherosclerosis of aorta: Secondary | ICD-10-CM | POA: Diagnosis not present

## 2016-07-22 MED ORDER — IOPAMIDOL (ISOVUE-370) INJECTION 76%
75.0000 mL | Freq: Once | INTRAVENOUS | Status: AC | PRN
  Administered 2016-07-22: 75 mL via INTRAVENOUS

## 2016-07-25 ENCOUNTER — Ambulatory Visit (HOSPITAL_BASED_OUTPATIENT_CLINIC_OR_DEPARTMENT_OTHER)
Admission: RE | Admit: 2016-07-25 | Discharge: 2016-07-25 | Disposition: A | Discharge status: Home / Self Care | Payer: Medicare Other | Source: Ambulatory Visit | Attending: Thoracic Surgery (Cardiothoracic Vascular Surgery) | Admitting: Thoracic Surgery (Cardiothoracic Vascular Surgery)

## 2016-07-25 ENCOUNTER — Encounter (HOSPITAL_COMMUNITY)
Admission: RE | Admit: 2016-07-25 | Discharge: 2016-07-25 | Disposition: A | Discharge status: Home / Self Care | Payer: Medicare Other | Source: Ambulatory Visit | Attending: Thoracic Surgery (Cardiothoracic Vascular Surgery) | Admitting: Thoracic Surgery (Cardiothoracic Vascular Surgery)

## 2016-07-25 ENCOUNTER — Encounter (HOSPITAL_COMMUNITY): Payer: Self-pay

## 2016-07-25 ENCOUNTER — Ambulatory Visit (INDEPENDENT_AMBULATORY_CARE_PROVIDER_SITE_OTHER): Payer: Medicare Other | Admitting: Thoracic Surgery (Cardiothoracic Vascular Surgery)

## 2016-07-25 ENCOUNTER — Encounter: Payer: Self-pay | Admitting: Thoracic Surgery (Cardiothoracic Vascular Surgery)

## 2016-07-25 ENCOUNTER — Ambulatory Visit (HOSPITAL_COMMUNITY)
Admission: RE | Admit: 2016-07-25 | Discharge: 2016-07-25 | Disposition: A | Discharge status: Home / Self Care | Payer: Medicare Other | Source: Ambulatory Visit | Attending: Thoracic Surgery (Cardiothoracic Vascular Surgery) | Admitting: Thoracic Surgery (Cardiothoracic Vascular Surgery)

## 2016-07-25 VITALS — BP 153/76 | HR 79 | Resp 16 | Ht 69.0 in | Wt 176.0 lb

## 2016-07-25 DIAGNOSIS — Z01812 Encounter for preprocedural laboratory examination: Secondary | ICD-10-CM | POA: Insufficient documentation

## 2016-07-25 DIAGNOSIS — Z01810 Encounter for preprocedural cardiovascular examination: Secondary | ICD-10-CM | POA: Insufficient documentation

## 2016-07-25 DIAGNOSIS — E8770 Fluid overload, unspecified: Secondary | ICD-10-CM | POA: Diagnosis not present

## 2016-07-25 DIAGNOSIS — I340 Nonrheumatic mitral (valve) insufficiency: Secondary | ICD-10-CM

## 2016-07-25 DIAGNOSIS — Q211 Atrial septal defect: Secondary | ICD-10-CM | POA: Diagnosis not present

## 2016-07-25 DIAGNOSIS — Z8249 Family history of ischemic heart disease and other diseases of the circulatory system: Secondary | ICD-10-CM | POA: Diagnosis not present

## 2016-07-25 DIAGNOSIS — M5030 Other cervical disc degeneration, unspecified cervical region: Secondary | ICD-10-CM | POA: Diagnosis not present

## 2016-07-25 DIAGNOSIS — Z86711 Personal history of pulmonary embolism: Secondary | ICD-10-CM | POA: Diagnosis not present

## 2016-07-25 DIAGNOSIS — I511 Rupture of chordae tendineae, not elsewhere classified: Secondary | ICD-10-CM | POA: Diagnosis not present

## 2016-07-25 DIAGNOSIS — Z79899 Other long term (current) drug therapy: Secondary | ICD-10-CM | POA: Diagnosis not present

## 2016-07-25 DIAGNOSIS — N281 Cyst of kidney, acquired: Secondary | ICD-10-CM | POA: Diagnosis not present

## 2016-07-25 DIAGNOSIS — Z01818 Encounter for other preprocedural examination: Secondary | ICD-10-CM | POA: Insufficient documentation

## 2016-07-25 DIAGNOSIS — K449 Diaphragmatic hernia without obstruction or gangrene: Secondary | ICD-10-CM | POA: Diagnosis not present

## 2016-07-25 DIAGNOSIS — Q2112 Patent foramen ovale: Secondary | ICD-10-CM

## 2016-07-25 DIAGNOSIS — D62 Acute posthemorrhagic anemia: Secondary | ICD-10-CM | POA: Diagnosis not present

## 2016-07-25 DIAGNOSIS — J9811 Atelectasis: Secondary | ICD-10-CM | POA: Diagnosis not present

## 2016-07-25 LAB — PULMONARY FUNCTION TEST
DL/VA % PRED: 73 %
DL/VA: 3.33 ml/min/mmHg/L
DLCO COR: 19.27 ml/min/mmHg
DLCO cor % pred: 62 %
DLCO unc % pred: 63 %
DLCO unc: 19.59 ml/min/mmHg
FEF 25-75 Post: 2.35 L/sec
FEF 25-75 Pre: 2.7 L/sec
FEF2575-%CHANGE-POST: -12 %
FEF2575-%PRED-PRE: 126 %
FEF2575-%Pred-Post: 110 %
FEV1-%Change-Post: -3 %
FEV1-%PRED-PRE: 93 %
FEV1-%Pred-Post: 89 %
FEV1-POST: 2.63 L
FEV1-PRE: 2.73 L
FEV1FVC-%CHANGE-POST: 2 %
FEV1FVC-%Pred-Pre: 107 %
FEV6-%CHANGE-POST: -5 %
FEV6-%PRED-PRE: 90 %
FEV6-%Pred-Post: 85 %
FEV6-POST: 3.25 L
FEV6-PRE: 3.44 L
FEV6FVC-%Change-Post: 0 %
FEV6FVC-%PRED-POST: 105 %
FEV6FVC-%PRED-PRE: 105 %
FVC-%CHANGE-POST: -6 %
FVC-%PRED-POST: 80 %
FVC-%PRED-PRE: 85 %
FVC-POST: 3.26 L
FVC-PRE: 3.47 L
POST FEV6/FVC RATIO: 100 %
PRE FEV1/FVC RATIO: 79 %
Post FEV1/FVC ratio: 81 %
Pre FEV6/FVC Ratio: 99 %
RV % PRED: 125 %
RV: 3.13 L
TLC % PRED: 100 %
TLC: 6.91 L

## 2016-07-25 LAB — PROTIME-INR
INR: 1.02
PROTHROMBIN TIME: 13.4 s (ref 11.4–15.2)

## 2016-07-25 LAB — URINALYSIS, ROUTINE W REFLEX MICROSCOPIC
Bilirubin Urine: NEGATIVE
Glucose, UA: NEGATIVE mg/dL
HGB URINE DIPSTICK: NEGATIVE
Ketones, ur: NEGATIVE mg/dL
Leukocytes, UA: NEGATIVE
Nitrite: NEGATIVE
PH: 6 (ref 5.0–8.0)
Protein, ur: NEGATIVE mg/dL
SPECIFIC GRAVITY, URINE: 1.02 (ref 1.005–1.030)

## 2016-07-25 LAB — VAS US DOPPLER PRE CABG
LCCADDIAS: 13 cm/s
LCCAPSYS: -104 cm/s
LEFT ECA DIAS: -10 cm/s
LEFT VERTEBRAL DIAS: 16 cm/s
Left CCA dist sys: 94 cm/s
Left CCA prox dias: -14 cm/s
Left ICA dist dias: -23 cm/s
Left ICA dist sys: -98 cm/s
Left ICA prox dias: -16 cm/s
Left ICA prox sys: -94 cm/s
RCCADSYS: -112 cm/s
RCCAPDIAS: -19 cm/s
RIGHT ECA DIAS: -3 cm/s
RIGHT VERTEBRAL DIAS: -16 cm/s
Right CCA prox sys: -134 cm/s

## 2016-07-25 LAB — CBC
HEMATOCRIT: 42.9 % (ref 39.0–52.0)
HEMOGLOBIN: 15.1 g/dL (ref 13.0–17.0)
MCH: 29.9 pg (ref 26.0–34.0)
MCHC: 35.2 g/dL (ref 30.0–36.0)
MCV: 85 fL (ref 78.0–100.0)
Platelets: 154 10*3/uL (ref 150–400)
RBC: 5.05 MIL/uL (ref 4.22–5.81)
RDW: 13.5 % (ref 11.5–15.5)
WBC: 6.8 10*3/uL (ref 4.0–10.5)

## 2016-07-25 LAB — TYPE AND SCREEN
ABO/RH(D): B NEG
Antibody Screen: NEGATIVE

## 2016-07-25 LAB — COMPREHENSIVE METABOLIC PANEL
ALK PHOS: 57 U/L (ref 38–126)
ALT: 17 U/L (ref 17–63)
ANION GAP: 10 (ref 5–15)
AST: 24 U/L (ref 15–41)
Albumin: 4.6 g/dL (ref 3.5–5.0)
BILIRUBIN TOTAL: 1.3 mg/dL — AB (ref 0.3–1.2)
BUN: 14 mg/dL (ref 6–20)
CALCIUM: 9.7 mg/dL (ref 8.9–10.3)
CO2: 20 mmol/L — ABNORMAL LOW (ref 22–32)
Chloride: 107 mmol/L (ref 101–111)
Creatinine, Ser: 0.94 mg/dL (ref 0.61–1.24)
GLUCOSE: 102 mg/dL — AB (ref 65–99)
Potassium: 4 mmol/L (ref 3.5–5.1)
Sodium: 137 mmol/L (ref 135–145)
Total Protein: 7.2 g/dL (ref 6.5–8.1)

## 2016-07-25 LAB — BLOOD GAS, ARTERIAL
Acid-base deficit: 0.8 mmol/L (ref 0.0–2.0)
BICARBONATE: 22.6 mmol/L (ref 20.0–28.0)
Drawn by: 470591
FIO2: 21
O2 Saturation: 95.5 %
PCO2 ART: 33 mmHg (ref 32.0–48.0)
PO2 ART: 74 mmHg — AB (ref 83.0–108.0)
Patient temperature: 98.6
pH, Arterial: 7.451 — ABNORMAL HIGH (ref 7.350–7.450)

## 2016-07-25 LAB — ABO/RH: ABO/RH(D): B NEG

## 2016-07-25 LAB — SURGICAL PCR SCREEN
MRSA, PCR: NEGATIVE
Staphylococcus aureus: NEGATIVE

## 2016-07-25 LAB — APTT: aPTT: 25 seconds (ref 24–36)

## 2016-07-25 MED ORDER — ALBUTEROL SULFATE (2.5 MG/3ML) 0.083% IN NEBU
2.5000 mg | INHALATION_SOLUTION | Freq: Once | RESPIRATORY_TRACT | Status: AC
  Administered 2016-07-25: 2.5 mg via RESPIRATORY_TRACT

## 2016-07-25 MED ORDER — CHLORHEXIDINE GLUCONATE 4 % EX LIQD: 30.0000 mL | CUTANEOUS | Status: DC

## 2016-07-25 NOTE — Progress Notes (Signed)
[address]       [place] [address]             [phone]     CARDIOTHORACIC SURGERY OFFICE NOTE  Referring Provider is Shalik Sanfilippo. Lucretia Roers, MD [facility]) PCP is [name], MD [facility])  HPI:  Patient returns to the office today for follow-up of severe symptomatic primary mitral regurgitation. He was originally seen in consultation on 06/28/2016. Since then he underwent TEE and diagnostic cardiac catheterization on 07/20/2016. TEE confirmed the presence of myxomatous degenerative disease of the mitral valve with a large flail segment of the posterior leaflet of mitral valve and severe mitral regurgitation.  In addition, there was a patent foramen ovale or possible small secundum type atrial septal defect. Left ventricle was dilated but left ventricular systolic function remained preserved. Diagnostic cardiac catheterization was notable for the absence of significant coronary artery disease.  There were large V waves on wedge tracing consistent with severe mitral regurgitation. Pulmonary artery pressures were normal. The patient subsequently underwent CT angiography and he returns to our office for follow-up today. He reports no new problems or complaints.   Current Outpatient Prescriptions  Medication Sig Dispense Refill  . amiodarone (PACERONE) 200 MG tablet Take 1 tablet (200 mg total) by mouth daily. 30 tablet 0   No current facility-administered medications for this visit.    Facility-Administered Medications Ordered in Other Visits  Medication Dose Route Frequency Provider Last Rate Last Dose  . chlorhexidine (HIBICLENS) 4 % liquid 2 application  30 mL Topical UD Purcell Nails, MD          Physical Exam:   BP (!) 153/76 (BP Location: Right Arm, Patient Position: Sitting, Cuff Size: Large)   Pulse 79   Resp 16   Ht 5\' 9"  (1.753 m)   Wt 176 lb (79.8 kg)   SpO2 97% Comment: ON RA  BMI 25.99 kg/m    General:  Well-appearing  Chest:   Clear to auscultation  CV:   Regular rate and rhythm with prominent systolic murmur  Incisions:  n/a  Abdomen:  Soft and nontender  Extremities:  Warm and well-perfused  Diagnostic Tests:  Right/Left Heart Cath and Coronary Angiography  Conclusion   1. Patent coronary arteries with minimal nonobstructive CAD, intramyocardial segment of mid-LAD noted 2. Normal LVEDP and PA pressure 3. Large V waves c/w severe mitral regurgitation  Recommend: Continued evaluation for mitral valve repair per Dr [name]  Indications   Severe mitral regurgitation [I34.0 (ICD-10-CM)]  Procedural Details/Technique   Technical Details INDICATION: Severe mitral regurgitation. Pre-operative study for planned mitral valve repair.  PROCEDURAL DETAILS: There was an indwelling IV in a right antecubital vein. Using normal sterile technique, the IV was changed out for a 5 Fr brachial sheath over a 0.018 inch wire. The right wrist was then prepped, draped, and anesthetized with 1% lidocaine. Using the modified Seldinger technique a 5/6 French Slender sheath was placed in the right radial artery. Intra-arterial verapamil was administered through the radial artery sheath. IV heparin was administered after a JR4 catheter was advanced into the central aorta. A Swan-Ganz catheter was used for the right heart catheterization. Standard protocol was followed for recording of right heart pressures and sampling of oxygen saturations. Fick cardiac output was calculated. Standard Judkins catheters were used for selective coronary angiography. The JR4 catheter is used to record LV pressure and Ao valve pullback. There were no  immediate procedural complications. The patient was transferred to the post catheterization recovery area for further monitoring.     Estimated blood loss <50 mL.  During this procedure the patient was administered the following to achieve and maintain moderate conscious  sedation: Versed 2 mg, Fentanyl 25 mcg, while the patient's heart rate, blood pressure, and oxygen saturation were continuously monitored. The period of conscious sedation was 22 minutes, of which I was present face-to-face 100% of this time.    Coronary Findings   Dominance: Right  Left Anterior Descending  Prox LAD lesion, 25% stenosed.  Mid LAD lesion, 25% stenosed. intramyocardial bridging  Ramus Intermedius  Vessel is angiographically normal.  Left Circumflex  The vessel exhibits minimal luminal irregularities.  Right Coronary Artery  Vessel is angiographically normal.  Right Heart   Right Heart Pressures LV EDP is normal. Large V-waves in PCWP tracing    Coronary Diagrams   Diagnostic Diagram       Implants     No implant documentation for this case.  PACS Images   Show images for Cardiac catheterization   Link to Procedure Log   Procedure Log    Hemo Data    Most Recent Value  Fick Cardiac Output 5.04 L/min  Fick Cardiac Output Index 2.57 (L/min)/BSA  RA A Wave 8 mmHg  RA V Wave 4 mmHg  RA Mean 3 mmHg  RV Systolic Pressure 30 mmHg  RV Diastolic Pressure 0 mmHg  RV EDP 7 mmHg  PA Systolic Pressure 35 mmHg  PA Diastolic Pressure 6 mmHg  PA Mean 19 mmHg  PW A Wave 11 mmHg  PW V Wave 22 mmHg  PW Mean 9 mmHg  AO Systolic Pressure 86 mmHg  AO Diastolic Pressure 39 mmHg  AO Mean 57 mmHg  LV Systolic Pressure 97 mmHg  LV Diastolic Pressure 2 mmHg  LV EDP 13 mmHg  Arterial Occlusion Pressure Extended Systolic Pressure 86 mmHg  Arterial Occlusion Pressure Extended Diastolic Pressure 37 mmHg  Arterial Occlusion Pressure Extended Mean Pressure 56 mmHg  Left Ventricular Apex Extended Systolic Pressure 90 mmHg  Left Ventricular Apex Extended Diastolic Pressure 0 mmHg  Left Ventricular Apex Extended EDP Pressure 5 mmHg  QP/QS 1  TPVR Index 7.39 HRUI  TSVR Index 22.15 HRUI  PVR SVR Ratio 0.19  TPVR/TSVR Ratio 0.33    Procedure: TEE  Indication: Mitral  valve prolapse.  Sedation: Versed 3 mg IV, Fentanyl 50 mcg IV  Findings: Please see echo section for full report.  Mildly dilated left ventricle with normal wall thickness.  Normal LV systolic function, EF 60-65% with normal wall motion. Normal right ventricular size and systolic function.  Normal right atrial size.  Moderately dilated left atrium.  No LA appendage thrombus.  There is a small left to right shunt via the interatrial septum.  I think this is probably a very small secundum ASD based on positioning (rather than PFO).  Trileaflet aortic valve with no significant regurgitation, no stenosis.  There is prolapse of the tricuspid valve with only trivial TR.  There is a myxomatous-appearing mitral valve with partial flail P2 segment.  I suspect that a ruptured chord is present.  Very eccentric, anteriorly-directed mitral regurgitation is severe.  Unable to do PISA because so eccentric.  Normal caliber aorta with minimal plaque.    Impression:  1. Preserved LV EF with mildly dilated LV.  2. 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  3.  Suspect very small secundum ASD.   Marca Ancona 07/20/2016 9:51 AM    CT ANGIOGRAPHY CHEST, ABDOMEN AND PELVIS  TECHNIQUE: Multidetector CT imaging through the chest, abdomen and pelvis was performed using the standard protocol during bolus administration of intravenous contrast. Multiplanar reconstructed images and MIPs were obtained and reviewed to evaluate the vascular anatomy.  CONTRAST:  75 mL Isovue 370  COMPARISON:  Chest CT 02/20/2006.  Abdominal CT 06/05/2013  FINDINGS: CTA CHEST FINDINGS  Cardiovascular: Normal caliber of the ascending thoracic aorta without dissection. Minimal atherosclerotic disease in the thoracic aorta. Great vessels are patent with normal arch configuration. Mild ectasia of the proximal descending thoracic aorta measuring up to 3.4 cm and  stable. Pulmonary arteries are not well opacified on this examination. Enlargement of left atrium compatible with mitral valve disease. Left atrium measures roughly 5.5 cm in the AP dimension. Normal appearance of the left atrial appendage. Heart size is within normal limits.  Mediastinum/Nodes: Prominent right hilar node on sequence 4, image 69 measures 1.4 cm in the short axis and minimally changed since 2007. Right subcarinal tissue measures up to 0.9 cm and stable. Few small lymph nodes in the AP window. Overall, no significant chest lymphadenopathy. Thyroid tissue is grossly normal. No axillary lymph node enlargement.  Lungs/Pleura: Trace left pleural fluid or pleural thickening. Trachea and mainstem bronchi are patent. Few parenchymal densities along the medial right upper lobe on sequence 5, image 53 are nonspecific and could represent mild scarring or atelectasis. Few pleural-based densities along the anterior right lung on sequence 5, image 91 suggestive for minimal scarring. Mild scarring at both lung apices. Minimal scarring or atelectasis along the medial right upper lobe. 5 mm nodule in the medial right upper lobe on sequence 5, image 48. Tiny nodules in the right upper lobe on image 46 and 47. Punctate nodule in the left upper lobe on sequence 5, image 37. These small pulmonary nodules were not confidently seen on the previous examination. Few peripheral patchy densities in left lung could represent atelectasis or mild scarring.  Musculoskeletal: No suspicious bone findings. Degenerative disc disease in lower cervical spine.  Review of the MIP images confirms the above findings.  CTA ABDOMEN AND PELVIS FINDINGS  VASCULAR  Aorta: Normal caliber of the abdominal aorta with mild atherosclerotic disease. No dissection.  Celiac: Large amount of calcified plaque at the origin of the celiac trunk causing moderate stenosis. This atherosclerotic disease  and stenosis is similar to the exam in 2015. Main branch vessels include the splenic artery, left gastric artery and common hepatic artery. GDA is patent.  SMA: Patent without evidence of aneurysm, dissection, vasculitis or significant stenosis.  Renals: Both renal arteries are patent without evidence of aneurysm, dissection, vasculitis, fibromuscular dysplasia or significant stenosis. Minimal calcified plaque involving the proximal renal arteries.  IMA: Patent without evidence of aneurysm, dissection, vasculitis or significant stenosis.  Inflow: Patent without evidence of aneurysm, dissection, vasculitis or significant stenosis. Iliac arteries are tortuous particularly the left common and left external iliac arteries. Internal iliac arteries are widely patent. Proximal femoral arteries are widely patent bilaterally.  Veins: Main portal venous system is patent. IVC and renal veins are patent.  Review of the MIP images confirms the above findings.  NON-VASCULAR  Hepatobiliary: Gallbladder has been removed. Again noted is a mildly hypodense structure along the hepatic dome on sequence 4, image 100 measuring roughly 1.4 cm. Hounsfield units are approximately 49. Ultrasound from 05/20/2014 demonstrated an 1.4 cm indeterminate hypoechoic structure  in the left hepatic lobe and this appears to correspond with that lesion. No other definitive hepatic lesions.  Pancreas: Normal appearance of the pancreas without inflammation or duct dilatation.  Spleen: Normal appearance of spleen without enlargement.  Adrenals/Urinary Tract: Normal adrenal glands. Probable tiny cyst in left kidney interpolar region on sequence 4, image 129. Exophytic cyst in the left kidney lower pole measuring up to 3.3 cm. Negative for hydronephrosis. No suspicious renal lesions. Urinary bladder is unremarkable.  Stomach/Bowel: Small hiatal hernia. There appears to be a normal appendix on  sequence 4, image 195. Colonic diverticula involving the left colon and sigmoid colon. No acute bowel inflammation.  Lymphatic: Few slightly prominent lymph nodes in the upper abdominal mesentery are similar to the previous examination. There is mild mesenteric stranding in this area. Overall, there is no significant lymph node enlargement in the abdomen or pelvis.  Reproductive: Prostate is prominent for size measuring up to 5.3 cm. Seminal vesicles are unremarkable.  Other: Again noted is a right inguinal hernia. A small amount of fluid within the right inguinal canal and difficult to exclude a right hydrocele based on these images. No free fluid in the pelvis. Negative for free air.  Musculoskeletal: Stable sclerosis involving the right pubic bone. Multilevel disc space loss with endplate sclerosis. Disc space disease is most prominent at L1-L2 and L3-L4. Facet arthropathy in the lumbar spine.  Review of the MIP images confirms the above findings.  IMPRESSION: Minimal atherosclerotic disease in the aorta and iliac arteries. Iliac arteries are tortuous, particularly on the left side.  Moderate stenosis at the origin of the celiac trunk but minimal change since 2015. The other visceral arteries appear to be widely patent.  Enlargement of the left atrium compatible with mitral valve disease.  No acute abnormality in the chest, abdomen or pelvis.  Scattered small pulmonary nodules in both lungs, largest measuring 5 mm. Nodules appear to be new since 2007 and they are nonspecific. Small peripheral patchy densities in both lungs which could represent atelectasis or small areas of scarring. No follow-up needed if patient is low-risk (and has no known or suspected primary neoplasm). Non-contrast chest CT can be considered in 12 months if patient is high-risk. This recommendation follows the consensus statement: Guidelines for Management of Incidental Pulmonary  Nodules Detected on CT Images: From the Fleischner Society 2017; Radiology 2017; 284:228-243.  Right inguinal hernia.  Indeterminate structure in the left hepatic lobe, segment 4A. Size of this structure has minimally changed since 2015 and could represent a complex cyst but nonspecific.  Diverticulosis without acute inflammation.  Small hiatal hernia.   Electronically Signed   By: Richarda Overlie M.D.   On: 07/22/2016 13:25   Impression:  Patient has mitral valve prolapse with stage D severe symptomatically primary mitral regurgitation. He reports relatively recent onset of mild symptoms of exertional shortness of breath consistent with chronic diastolic congestive heart failure, New York Heart Association function class 1-2. I personally reviewed the patient's recent TEE and diagnostic cardiac catheterization. TEE confirmed the presence of myxomatous degenerative disease of the mitral valve with a large flail segment involving the middle scallop of the posterior leaflet and severe regurgitation. There do not appear to be any significant complicating features in the valve should be repairable with anticipation of excellent durability.  The patient does have a patent foramen ovale. The left ventricle is dilated but systolic function appears preserved. Diagnostic cardiac catheterization is notable for the absence of significant coronary artery disease and  normal pulmonary artery pressures. CT angiography of the chest, abdomen, and pelvis demonstrate adequate pelvic vascular access to facilitate peripheral cannulation for surgery.   Plan:  The patient and his wife were again counseled at length regarding the indications, risks and potential benefits of mitral valve repair.  The rationale for elective surgery has been explained, including a comparison between surgery and continued medical therapy with close follow-up.  The likelihood of successful and durable valve repair has been discussed  with particular reference to the findings of their recent echocardiogram.  Based upon these findings and previous experience, I have quoted them a greater than 95 percent likelihood of successful valve repair.  Expectations for his postoperative convalescence at been discussed in detail. The patient understands and accepts all potential risks of surgery including but not limited to risk of death, stroke or other neurologic complication, myocardial infarction, congestive heart failure, respiratory failure, renal failure, bleeding requiring transfusion and/or reexploration, arrhythmia, infection or other wound complications, pneumonia, pleural and/or pericardial effusion, pulmonary embolus, aortic dissection or other major vascular complication, or delayed complications related to valve repair or replacement including but not limited to structural valve deterioration and failure, thrombosis, embolization, endocarditis, or paravalvular leak.  Alternative surgical approaches have been discussed including a comparison between conventional sternotomy and minimally-invasive techniques.  The relative risks and benefits of each have been reviewed as they pertain to the patient's specific circumstances, and all of their questions have been addressed.  Specific risks potentially related to the minimally-invasive approach were discussed at length, including but not limited to risk of conversion to full or partial sternotomy, aortic dissection or other major vascular complication, unilateral acute lung injury or pulmonary edema, phrenic nerve dysfunction or paralysis, rib fracture, chronic pain, lung hernia, or lymphocele. All of their questions have been answered.  We plan surgery on 07/27/2016.  I spent in excess of 15 minutes during the conduct of this office consultation and >50% of this time involved direct face-to-face encounter with the patient for counseling and/or coordination of their care.    Salvatore Decent. [name],  MD 07/25/2016 1:23 PM

## 2016-07-25 NOTE — Progress Notes (Addendum)
Cardiologist is Dr.Cooper but has never seen him in his office  Medical Md doesn't have one  Echo report in epic from 06-30-16  Stress test denies  Heart cath report in epic from 07-20-16  EKG to be done at PAT  CXR to be done at PAT

## 2016-07-25 NOTE — Pre-Procedure Instructions (Signed)
Jeremy Fisher  07/25/2016      [facility] [place], [place] - [address] [address] Phone: [phone] Fax: [phone]  [id number] - , [place] - [address] AT [facility] OF [address] [place] [address] Phone: [phone] Fax: [phone]    Your procedure is scheduled on Wed, April 25 @ 7:30 AM  Report to [facility] Admitting at 5:30 AM  Call this number if you have problems the morning of surgery:  [phone]   Remember:  Do not eat food or drink liquids after midnight.  Take these medicines the morning of surgery with A SIP OF WATER Amiodareone(Pacerone)             No Goody's,BC's,Aleve,Advil,Motrin,Ibuprofen,Fish Oil,or any Herbal Medications.    Do not wear jewelry.  Do not wear lotions, powders,colognes, or deoderant.  Men may shave face and neck.  Do not bring valuables to the hospital.  [facility] is not responsible for any belongings or valuables.  Contacts, dentures or bridgework may not be worn into surgery.  Leave your suitcase in the car.  After surgery it may be brought to your room.  For patients admitted to the hospital, discharge time will be determined by your treatment team.  Patients discharged the day of surgery will not be allowed to drive home.    [facility]  Before surgery, you can play an important role.  Because skin is not sterile, your skin needs to be as free of germs as possible.  You can reduce the number of germs on you skin by washing with CHG (chlorahexidine gluconate) soap before surgery.  CHG is an antiseptic cleaner which kills germs and bonds with the skin to continue killing germs even after washing.  Please DO NOT use if you have an allergy to CHG or antibacterial soaps.  If your skin becomes reddened/irritated stop using the CHG and inform your nurse when  you arrive at Short Stay.  Do not shave (including legs and underarms) for at least 48 hours prior to the first CHG shower.  You may shave your face.  Please follow these instructions carefully:   1.  Shower with CHG Soap the night before surgery and the                                morning of Surgery.  2.  If you choose to wash your hair, wash your hair first as usual with your       normal shampoo.  3.  After you shampoo, rinse your hair and body thoroughly to remove the                      Shampoo.  4.  Use CHG as you would any other liquid soap.  You can apply chg directly       to the skin and wash gently with scrungie or a clean washcloth.  5.  Apply the CHG Soap to your body ONLY FROM THE NECK DOWN.        Do not use on open wounds or open sores.  Avoid contact with your eyes,       ears, mouth and genitals (private parts).  Wash genitals (private parts)       with  your normal soap.  6.  Wash thoroughly, paying special attention to the area where your surgery        will be performed.  7.  Thoroughly rinse your body with warm water from the neck down.  8.  DO NOT shower/wash with your normal soap after using and rinsing off       the CHG Soap.  9.  Pat yourself dry with a clean towel.            10.  Wear clean pajamas.            11.  Place clean sheets on your bed the night of your first shower and do not        sleep with pets.  Day of Surgery  Do not apply any lotions/deoderants the morning of surgery.  Please wear clean clothes to the hospital/surgery center.    Please read over the following fact sheets that you were given. Pain Booklet, Coughing and Deep Breathing, MRSA Information and Surgical Site Infection Prevention

## 2016-07-25 NOTE — Progress Notes (Signed)
Pre-op Cardiac Surgery  Carotid Findings:  1-39% ICA plaquing. Vertebral artery flow is antegrade.   Upper Extremity Right Left  Brachial Pressures 130T 151T  Radial Waveforms T T  Ulnar Waveforms T T  Palmar Arch (Allen's Test) Doppler signal reverses with radial compression and remains normal with ulnar compression WNL   Findings:      Lower  Extremity Right Left  Dorsalis Pedis    Anterior Tibial    Posterior Tibial    Ankle/Brachial Indices      Findings:

## 2016-07-25 NOTE — H&P (Signed)
[address]       [place] [id number]             [phone]          CARDIOTHORACIC SURGERY HISTORY AND PHYSICAL EXAM  Referring Provider is Kristine Garbe. Lucretia Roers, MD [facility]) PCP is [name], MD [facility])       Chief Complaint  Patient presents with  . 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  He suffered a pulmonary embolism approximately 10 years ago and was treated with warfarin anticoagulation for approximately 5 years. He underwent an extensive hematologic workup that was essentially unrevealing.  In 2015 he presented with gallstone pancreatitis. He underwent laparoscopic cholecystectomy and later underwent ERCP and recovered uneventfully.  He has otherwise been remarkably healthy all of his adult life. He denies any history of hypertension, hyperlipidemia, diabetes mellitus, palpitations, or previous history of heart murmur. He was recently evaluated at the [facility] to establish primary care because he is a service connected veteran having served in [place].  He was noted to have a prominent murmur on physical exam and a transthoracic echocardiogram was performed revealing the presence of mitral valve prolapse with a large flail segment of the posterior leaflet of the mitral valve and severe mitral regurgitation. The patient has been referred for elective surgical consultation.  The patient is married and lives most of time locally with his wife in [place]. They also have a second home in [place] they [place] a fair amount of time, particularly during summer months. The patient has been  physically active and without any significant physical limitations all of his life. He does not exercise on a regular basis but he enjoys woodworking and keeps himself very busy with a variety of different jobs. He states that he has recently noted the development of mild exertional shortness of breath. He states that he only appreciated this when he walked up a flight of stairs recently. He only gets short of breath if he is pushing himself, such as going up 2 or 3 flights of stairs. He experiences no shortness of breath with ordinary activities around the house. He has never had any chest pain or chest tightness. He denies any palpitations, dizzy spells, or syncope.  Patient returns to the office today for follow-up of severe symptomatic primary mitral regurgitation. He was originally seen in consultation on 06/28/2016. Since then he underwent TEE and diagnostic cardiac catheterization on 07/20/2016. TEE confirmed the presence of myxomatous degenerative disease of the mitral valve with a large flail segment of the posterior leaflet of mitral valve and severe mitral regurgitation.  In addition, there was a patent foramen ovale or possible small secundum type atrial septal defect. Left ventricle was dilated but left ventricular systolic function remained preserved. Diagnostic cardiac catheterization was notable for the absence of significant coronary artery disease.  There were large V waves on wedge tracing consistent with severe mitral regurgitation. Pulmonary artery pressures were normal. The patient subsequently underwent CT angiography and he returns to our office for follow-up today. He reports no new problems or complaints.    Past Medical History:  Diagnosis Date  .  Mitral valve prolapse   . Patent foramen ovale 07/20/2016  . Pulmonary embolism (HCC) 2007   was on Coumadin but has been off for 5 yrs   . Severe mitral regurgitation     Past Surgical History:  Procedure Laterality Date  .  CHOLECYSTECTOMY N/A 06/17/2013   Procedure: LAPAROSCOPIC CHOLECYSTECTOMY WITH INTRAOPERATIVE CHOLANGIOGRAM;  Surgeon: Mariella Saa, MD;  Location: [facility] ORS;  Service: General;  Laterality: N/A;  . ERCP N/A 05/22/2014   Procedure: ENDOSCOPIC RETROGRADE CHOLANGIOPANCREATOGRAPHY (ERCP);  Surgeon: Louis Meckel, MD;  Location: [facility] ENDOSCOPY;  Service: Endoscopy;  Laterality: N/A;  . EYE SURGERY     right eye  . HERNIA REPAIR Left   . RIGHT/LEFT HEART CATH AND CORONARY ANGIOGRAPHY N/A 07/20/2016   Procedure: Right/Left Heart Cath and Coronary Angiography;  Surgeon: Tonny Bollman, MD;  Location: [facility] INVASIVE CV LAB;  Service: Cardiovascular;  Laterality: N/A;  . SPYGLASS CHOLANGIOSCOPY N/A 05/22/2014   Procedure: XBJYNWGN CHOLANGIOSCOPY;  Surgeon: Louis Meckel, MD;  Location: [facility] END[facility]COPY;  Service: Endoscopy;  Laterality: N/A;  . TEE WITHOUT CARDIOVERSION N/A 07/20/2016   Procedure: TRANSESOPHAGEAL ECHOCARDIOGRAM (TEE);  Surgeon: Laurey Morale, MD;  Location: [facility] ENDOSCOPY;  Service: Cardiovascular;  Laterality: N/A;    Family History  Problem Relation Age of Onset  . Pulmonary fibrosis Father   . Heart disease Father     died d/t MI    Social History Social History  Substance Use Topics  . Smoking status: Never Smoker  . Smokeless tobacco: Never Used  . Alcohol use Yes     Comment: rarely    Prior to Admission medications   Medication Sig Start Date End Date Taking? Authorizing Provider  amiodarone (PACERONE) 200 MG tablet Take 1 tablet (200 mg total) by mouth daily. 07/21/16   Purcell Nails, MD    Allergies  Allergen Reactions  . Primaxin [Imipenem] Other (See Comments)    seizure     Review of Systems:              General:                      normal appetite, normal energy, no weight gain, no weight loss, no fever             Cardiac:                       no chest pain with exertion, no chest pain at rest, + SOB with strenuous exertion, no resting SOB, no PND,  no orthopnea, no palpitations, no arrhythmia, no atrial fibrillation, no LE edema, no dizzy spells, no syncope             Respiratory:                 no shortness of breath, no home oxygen, no productive cough, no dry cough, no bronchitis, no wheezing, no hemoptysis, no asthma, no pain with inspiration or cough, no sleep apnea, no CPAP at night             GI:                               no difficulty swallowing, no reflux, no frequent heartburn, no hiatal hernia, no abdominal pain, no constipation, no diarrhea, no hematochezia, no hematemesis, no melena  GU:                              no dysuria,  no frequency, no urinary tract infection, no hematuria, no enlarged prostate, no kidney stones, no kidney disease             Vascular:                     no pain suggestive of claudication, no pain in feet, no leg cramps, no varicose veins, no DVT, no non-healing foot ulcer             Neuro:                         no stroke, no TIA's, no seizures, no headaches, no temporary blindness one eye,  no slurred speech, no peripheral neuropathy, no chronic pain, no instability of gait, no memory/cognitive dysfunction             Musculoskeletal:         no arthritis, no joint swelling, no myalgias, no difficulty walking, normal mobility              Skin:                            no rash, no itching, no skin infections, no pressure sores or ulcerations             Psych:                         no anxiety, no depression, no nervousness, no unusual recent stress             Eyes:                           no blurry vision, no floaters, no recent vision changes, + wears glasses or contacts             ENT:                            no hearing loss, no loose or painful teeth, no dentures, last saw dentist 3 months ago             Hematologic:               no easy bruising, no abnormal bleeding, no clotting disorder, no frequent epistaxis             Endocrine:                   no diabetes, does  not check CBG's at home                           Physical Exam:              BP (!) 153/84 (BP Location: Right Arm, Patient Position: Sitting, Cuff Size: Large)   Pulse 64   Resp 16   Ht 5\' 9"  (1.753 m)   Wt 176 lb (79.8 kg)   SpO2 98% Comment: ON RA  BMI 25.99 kg/m              General:  well-appearing             HEENT:                       Unremarkable              Neck:                           no JVD, no bruits, no adenopathy              Chest:                          clear to auscultation, symmetrical breath sounds, no wheezes, no rhonchi              CV:                              RRR, prominent grade IV/VI holosystolic murmur              Abdomen:                    soft, non-tender, no masses              Extremities:                 warm, well-perfused, pulses palpable, no LE edema             Rectal/GU                   Deferred             Neuro:                         Grossly non-focal and symmetrical throughout             Skin:                            Clean and dry, no rashes, no breakdown   Diagnostic Tests:  TRANSTHORACIC ECHOCARDIOGRAM  Both the images and report from transthoracic echocardiogram performed 05/31/2016 at the [facility] have been reviewed. Sound transmission and image quality is adequate. The patient has mitral valve prolapse with an obvious flail segment involving the middle scallop of the posterior leaflet of the mitral valve.  There is severe mitral regurgitation which is directed anteriorly around the left atrium. There is severe left atrial enlargement. Left ventricular size and systolic function appears normal. Ejection fraction was visually estimated 60% and calculated 64%.  Right ventricular size and function appeared normal. There was mild central tricuspid regurgitation. RV systolic pressure was estimated 6 mmHg.    Right/Left Heart Cath and Coronary Angiography  Conclusion   1. Patent  coronary arteries with minimal nonobstructive CAD, intramyocardial segment of mid-LAD noted 2. Normal LVEDP and PA pressure 3. Large V waves c/w severe mitral regurgitation  Recommend: Continued evaluation for mitral valve repair per Dr [name]  Indications   Severe mitral regurgitation [I34.0 (ICD-10-CM)]  Procedural Details/Technique   Technical Details INDICATION: Severe mitral regurgitation. Pre-operative study for planned mitral valve repair.  PROCEDURAL DETAILS: There was an indwelling IV in a right antecubital vein. Using normal sterile technique, the IV was changed out for a 5 Fr brachial sheath over a  0.018 inch wire. The right wrist was then prepped, draped, and anesthetized with 1% lidocaine. Using the modified Seldinger technique a 5/6 French Slender sheath was placed in the right radial artery. Intra-arterial verapamil was administered through the radial artery sheath. IV heparin was administered after a JR4 catheter was advanced into the central aorta. A Swan-Ganz catheter was used for the right heart catheterization. Standard protocol was followed for recording of right heart pressures and sampling of oxygen saturations. Fick cardiac output was calculated. Standard Judkins catheters were used for selective coronary angiography. The JR4 catheter is used to record LV pressure and Ao valve pullback. There were no immediate procedural complications. The patient was transferred to the post catheterization recovery area for further monitoring.     Estimated blood loss <50 mL.  During this procedure the patient was administered the following to achieve and maintain moderate conscious sedation: Versed 2 mg, Fentanyl 25 mcg, while the patient's heart rate, blood pressure, and oxygen saturation were continuously monitored. The period of conscious sedation was 22 minutes, of which I was present face-to-face 100% of this time.    Coronary Findings   Dominance: Right  Left Anterior  Descending  Prox LAD lesion, 25% stenosed.  Mid LAD lesion, 25% stenosed. intramyocardial bridging  Ramus Intermedius  Vessel is angiographically normal.  Left Circumflex  The vessel exhibits minimal luminal irregularities.  Right Coronary Artery  Vessel is angiographically normal.  Right Heart   Right Heart Pressures LV EDP is normal. Large V-waves in PCWP tracing    Coronary Diagrams   Diagnostic Diagram       Implants        No implant documentation for this case.  PACS Images   Show images for Cardiac catheterization   Link to Procedure Log   Procedure Log    Hemo Data    Most Recent Value  Fick Cardiac Output 5.04 L/min  Fick Cardiac Output Index 2.57 (L/min)/BSA  RA A Wave 8 mmHg  RA V Wave 4 mmHg  RA Mean 3 mmHg  RV Systolic Pressure 30 mmHg  RV Diastolic Pressure 0 mmHg  RV EDP 7 mmHg  PA Systolic Pressure 35 mmHg  PA Diastolic Pressure 6 mmHg  PA Mean 19 mmHg  PW A Wave 11 mmHg  PW V Wave 22 mmHg  PW Mean 9 mmHg  AO Systolic Pressure 86 mmHg  AO Diastolic Pressure 39 mmHg  AO Mean 57 mmHg  LV Systolic Pressure 97 mmHg  LV Diastolic Pressure 2 mmHg  LV EDP 13 mmHg  Arterial Occlusion Pressure Extended Systolic Pressure 86 mmHg  Arterial Occlusion Pressure Extended Diastolic Pressure 37 mmHg  Arterial Occlusion Pressure Extended Mean Pressure 56 mmHg  Left Ventricular Apex Extended Systolic Pressure 90 mmHg  Left Ventricular Apex Extended Diastolic Pressure 0 mmHg  Left Ventricular Apex Extended EDP Pressure 5 mmHg  QP/QS 1  TPVR Index 7.39 HRUI  TSVR Index 22.15 HRUI  PVR SVR Ratio 0.19  TPVR/TSVR Ratio 0.33    Procedure: TEE  Indication: Mitral valve prolapse.  Sedation: Versed 3 mg IV, Fentanyl 50 mcg IV  Findings: Please see echo section for full report. Mildly dilated left ventricle with normal wall thickness. Normal LV systolic function, EF 60-65% with normal wall motion. Normal right ventricular size and systolic  function. Normal right atrial size. Moderately dilated left atrium. No LA appendage thrombus. There is a small left to right shunt via the interatrial septum. I think this is probably a very small secundum  ASD based on positioning (rather than PFO). Trileaflet aortic valve with no significant regurgitation, no stenosis. There is prolapse of the tricuspid valve with only trivial TR. There is a myxomatous-appearing mitral valve with partial flail P2 segment. I suspect that a ruptured chord is present. Very eccentric, anteriorly-directed mitral regurgitation is severe. Unable to do PISA because so eccentric. Normal caliber aorta with minimal plaque.   Impression:  1. Preserved LV EF with mildly dilated LV.  2. 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  3. Suspect very small secundum ASD.   Marca Ancona 07/20/2016 9:51 AM    CT ANGIOGRAPHY CHEST, ABDOMEN AND PELVIS  TECHNIQUE: Multidetector CT imaging through the chest, abdomen and pelvis was performed using the standard protocol during bolus administration of intravenous contrast. Multiplanar reconstructed images and MIPs were obtained and reviewed to evaluate the vascular anatomy.  CONTRAST: 75 mL Isovue 370  COMPARISON: Chest CT 02/20/2006. Abdominal CT 06/05/2013  FINDINGS: CTA CHEST FINDINGS  Cardiovascular: Normal caliber of the ascending thoracic aorta without dissection. Minimal atherosclerotic disease in the thoracic aorta. Great vessels are patent with normal arch configuration. Mild ectasia of the proximal descending thoracic aorta measuring up to 3.4 cm and stable. Pulmonary arteries are not well opacified on this examination. Enlargement of left atrium compatible with mitral valve disease. Left atrium measures roughly 5.5 cm in the AP dimension. Normal appearance of the left atrial appendage. Heart size is within normal  limits.  Mediastinum/Nodes: Prominent right hilar node on sequence 4, image 69 measures 1.4 cm in the short axis and minimally changed since 2007. Right subcarinal tissue measures up to 0.9 cm and stable. Few small lymph nodes in the AP window. Overall, no significant chest lymphadenopathy. Thyroid tissue is grossly normal. No axillary lymph node enlargement.  Lungs/Pleura: Trace left pleural fluid or pleural thickening. Trachea and mainstem bronchi are patent. Few parenchymal densities along the medial right upper lobe on sequence 5, image 53 are nonspecific and could represent mild scarring or atelectasis. Few pleural-based densities along the anterior right lung on sequence 5, image 91 suggestive for minimal scarring. Mild scarring at both lung apices. Minimal scarring or atelectasis along the medial right upper lobe. 5 mm nodule in the medial right upper lobe on sequence 5, image 48. Tiny nodules in the right upper lobe on image 46 and 47. Punctate nodule in the left upper lobe on sequence 5, image 37. These small pulmonary nodules were not confidently seen on the previous examination. Few peripheral patchy densities in left lung could represent atelectasis or mild scarring.  Musculoskeletal: No suspicious bone findings. Degenerative disc disease in lower cervical spine.  Review of the MIP images confirms the above findings.  CTA ABDOMEN AND PELVIS FINDINGS  VASCULAR  Aorta: Normal caliber of the abdominal aorta with mild atherosclerotic disease. No dissection.  Celiac: Large amount of calcified plaque at the origin of the celiac trunk causing moderate stenosis. This atherosclerotic disease and stenosis is similar to the exam in 2015. Main branch vessels include the splenic artery, left gastric artery and common hepatic artery. GDA is patent.  SMA: Patent without evidence of aneurysm, dissection, vasculitis or significant stenosis.  Renals: Both renal  arteries are patent without evidence of aneurysm, dissection, vasculitis, fibromuscular dysplasia or significant stenosis. Minimal calcified plaque involving the proximal renal arteries.  IMA: Patent without evidence of aneurysm, dissection, vasculitis or significant stenosis.  Inflow: Patent without evidence of aneurysm, dissection, vasculitis or  significant stenosis. Iliac arteries are tortuous particularly the left common and left external iliac arteries. Internal iliac arteries are widely patent. Proximal femoral arteries are widely patent bilaterally.  Veins: Main portal venous system is patent. IVC and renal veins are patent.  Review of the MIP images confirms the above findings.  NON-VASCULAR  Hepatobiliary: Gallbladder has been removed. Again noted is a mildly hypodense structure along the hepatic dome on sequence 4, image 100 measuring roughly 1.4 cm. Hounsfield units are approximately 49. Ultrasound from 05/20/2014 demonstrated an 1.4 cm indeterminate hypoechoic structure in the left hepatic lobe and this appears to correspond with that lesion. No other definitive hepatic lesions.  Pancreas: Normal appearance of the pancreas without inflammation or duct dilatation.  Spleen: Normal appearance of spleen without enlargement.  Adrenals/Urinary Tract: Normal adrenal glands. Probable tiny cyst in left kidney interpolar region on sequence 4, image 129. Exophytic cyst in the left kidney lower pole measuring up to 3.3 cm. Negative for hydronephrosis. No suspicious renal lesions. Urinary bladder is unremarkable.  Stomach/Bowel: Small hiatal hernia. There appears to be a normal appendix on sequence 4, image 195. Colonic diverticula involving the left colon and sigmoid colon. No acute bowel inflammation.  Lymphatic: Few slightly prominent lymph nodes in the upper abdominal mesentery are similar to the previous examination. There is mild mesenteric stranding in  this area. Overall, there is no significant lymph node enlargement in the abdomen or pelvis.  Reproductive: Prostate is prominent for size measuring up to 5.3 cm. Seminal vesicles are unremarkable.  Other: Again noted is a right inguinal hernia. A small amount of fluid within the right inguinal canal and difficult to exclude a right hydrocele based on these images. No free fluid in the pelvis. Negative for free air.  Musculoskeletal: Stable sclerosis involving the right pubic bone. Multilevel disc space loss with endplate sclerosis. Disc space disease is most prominent at L1-L2 and L3-L4. Facet arthropathy in the lumbar spine.  Review of the MIP images confirms the above findings.  IMPRESSION: Minimal atherosclerotic disease in the aorta and iliac arteries. Iliac arteries are tortuous, particularly on the left side.  Moderate stenosis at the origin of the celiac trunk but minimal change since 2015. The other visceral arteries appear to be widely patent.  Enlargement of the left atrium compatible with mitral valve disease.  No acute abnormality in the chest, abdomen or pelvis.  Scattered small pulmonary nodules in both lungs, largest measuring 5 mm. Nodules appear to be new since 2007 and they are nonspecific. Small peripheral patchy densities in both lungs which could represent atelectasis or small areas of scarring. No follow-up needed if patient is low-risk (and has no known or suspected primary neoplasm). Non-contrast chest CT can be considered in 12 months if patient is high-risk. This recommendation follows the consensus statement: Guidelines for Management of Incidental Pulmonary Nodules Detected on CT Images: From the Fleischner Society 2017; Radiology 2017; 284:228-243.  Right inguinal hernia.  Indeterminate structure in the left hepatic lobe, segment 4A. Size of this structure has minimally changed since 2015 and could represent a complex cyst but  nonspecific.  Diverticulosis without acute inflammation.  Small hiatal hernia.   Electronically Signed By: Richarda Overlie M.D. On: 07/22/2016 13:25   Impression:  Patient has mitral valve prolapse with stage D severe symptomatically primary mitral regurgitation. He reports relatively recent onset of mild symptoms of exertional shortness of breath consistent with chronic diastolic congestive heart failure, New York Heart Association function class 1-2. I personally reviewed  the patient's recent TEE and diagnostic cardiac catheterization. TEE confirmed the presence of myxomatous degenerative disease of the mitral valve with a large flail segment involving the middle scallop of the posterior leaflet and severe regurgitation. There do not appear to be any significant complicating features in the valve should be repairable with anticipation of excellent durability.  The patient does have a patent foramen ovale. The left ventricle is dilated but systolic function appears preserved. Diagnostic cardiac catheterization is notable for the absence of significant coronary artery disease and normal pulmonary artery pressures. CT angiography of the chest, abdomen, and pelvis demonstrate adequate pelvic vascular access to facilitate peripheral cannulation for surgery.   Plan:  The patient and his wife were again counseled at length regarding the indications, risks and potential benefits of mitral valve repair.  The rationale for elective surgery has been explained, including a comparison between surgery and continued medical therapy with close follow-up.  The likelihood of successful and durable valve repair has been discussed with particular reference to the findings of their recent echocardiogram.  Based upon these findings and previous experience, I have quoted them a greater than 95 percent likelihood of successful valve repair.  Expectations for his postoperative convalescence at been discussed in  detail. The patient understands and accepts all potential risks of surgery including but not limited to risk of death, stroke or other neurologic complication, myocardial infarction, congestive heart failure, respiratory failure, renal failure, bleeding requiring transfusion and/or reexploration, arrhythmia, infection or other wound complications, pneumonia, pleural and/or pericardial effusion, pulmonary embolus, aortic dissection or other major vascular complication, or delayed complications related to valve repair or replacement including but not limited to structural valve deterioration and failure, thrombosis, embolization, endocarditis, or paravalvular leak.  Alternative surgical approaches have been discussed including a comparison between conventional sternotomy and minimally-invasive techniques.  The relative risks and benefits of each have been reviewed as they pertain to the patient's specific circumstances, and all of their questions have been addressed.  Specific risks potentially related to the minimally-invasive approach were discussed at length, including but not limited to risk of conversion to full or partial sternotomy, aortic dissection or other major vascular complication, unilateral acute lung injury or pulmonary edema, phrenic nerve dysfunction or paralysis, rib fracture, chronic pain, lung hernia, or lymphocele. All of their questions have been answered.  We plan surgery on 07/27/2016.    Salvatore Decent. [name], MD 07/25/2016 1:23 PM

## 2016-07-26 LAB — HEMOGLOBIN A1C
HEMOGLOBIN A1C: 5 % (ref 4.8–5.6)
Mean Plasma Glucose: 97 mg/dL

## 2016-07-26 MED ORDER — VANCOMYCIN HCL 10 G IV SOLR
1250.0000 mg | INTRAVENOUS | Status: AC
  Administered 2016-07-27: 1250 mg via INTRAVENOUS
  Filled 2016-07-26: qty 1250

## 2016-07-26 MED ORDER — SODIUM CHLORIDE 0.9 % IV SOLN
INTRAVENOUS | Status: DC
  Filled 2016-07-26: qty 2.5

## 2016-07-26 MED ORDER — TRANEXAMIC ACID (OHS) BOLUS VIA INFUSION
15.0000 mg/kg | INTRAVENOUS | Status: AC
  Administered 2016-07-27: 1197 mg via INTRAVENOUS
  Filled 2016-07-26: qty 1197

## 2016-07-26 MED ORDER — MAGNESIUM SULFATE 50 % IJ SOLN
40.0000 meq | INTRAMUSCULAR | Status: DC
  Filled 2016-07-26: qty 10

## 2016-07-26 MED ORDER — SODIUM CHLORIDE 0.9 % IV SOLN
1.5000 mg/kg/h | INTRAVENOUS | Status: DC
  Filled 2016-07-26: qty 25

## 2016-07-26 MED ORDER — SODIUM CHLORIDE 0.9 % IV SOLN
30.0000 ug/min | INTRAVENOUS | Status: DC
  Filled 2016-07-26: qty 2

## 2016-07-26 MED ORDER — SODIUM CHLORIDE 0.9 % IV SOLN
INTRAVENOUS | Status: DC
  Filled 2016-07-26: qty 30

## 2016-07-26 MED ORDER — MANNITOL 20% IV SOLUTION 10G/50ML
6.4000 g | INTRAVENOUS | Status: DC
  Filled 2016-07-26: qty 60

## 2016-07-26 MED ORDER — VANCOMYCIN HCL 1000 MG IV SOLR
INTRAVENOUS | Status: AC
  Administered 2016-07-27: 1000 mL
  Filled 2016-07-26: qty 1000

## 2016-07-26 MED ORDER — LEVOFLOXACIN IN D5W 500 MG/100ML IV SOLN
500.0000 mg | INTRAVENOUS | Status: AC
  Administered 2016-07-27: 500 mg via INTRAVENOUS
  Filled 2016-07-26: qty 100

## 2016-07-26 MED ORDER — LIDOCAINE HCL (CARDIAC) 20 MG/ML IV SOLN
260.0000 mg | Freq: Once | INTRAVENOUS | Status: DC
  Filled 2016-07-26: qty 13

## 2016-07-26 MED ORDER — NITROGLYCERIN IN D5W 200-5 MCG/ML-% IV SOLN
2.0000 ug/min | INTRAVENOUS | Status: AC
  Administered 2016-07-27: 10 ug/min via INTRAVENOUS
  Filled 2016-07-26: qty 250

## 2016-07-26 MED ORDER — TRANEXAMIC ACID (OHS) PUMP PRIME SOLUTION
2.0000 mg/kg | INTRAVENOUS | Status: AC
  Administered 2016-07-27: 12 mL/h
  Filled 2016-07-26: qty 1.6

## 2016-07-26 MED ORDER — PLASMA-LYTE 148 IV SOLN
INTRAVENOUS | Status: DC
  Filled 2016-07-26: qty 2.5

## 2016-07-26 MED ORDER — POTASSIUM CHLORIDE 2 MEQ/ML IV SOLN
80.0000 meq | INTRAVENOUS | Status: DC
  Filled 2016-07-26: qty 40

## 2016-07-26 MED ORDER — DOPAMINE-DEXTROSE 3.2-5 MG/ML-% IV SOLN
0.0000 ug/kg/min | INTRAVENOUS | Status: DC
  Filled 2016-07-26: qty 250

## 2016-07-26 MED ORDER — DEXMEDETOMIDINE HCL IN NACL 400 MCG/100ML IV SOLN
0.1000 ug/kg/h | INTRAVENOUS | Status: DC
  Filled 2016-07-26: qty 100

## 2016-07-26 MED ORDER — METOPROLOL TARTRATE 12.5 MG HALF TABLET
12.5000 mg | ORAL_TABLET | Freq: Once | ORAL | Status: AC
  Administered 2016-07-27: 12.5 mg via ORAL
  Filled 2016-07-26: qty 1

## 2016-07-26 MED ORDER — GLUTARALDEHYDE 0.625% SOAKING SOLUTION
TOPICAL | Status: DC | PRN
  Filled 2016-07-26: qty 50

## 2016-07-26 MED ORDER — EPINEPHRINE PF 1 MG/ML IJ SOLN
0.0000 ug/min | INTRAMUSCULAR | Status: DC
  Filled 2016-07-26: qty 4

## 2016-07-26 MED ORDER — CHLORHEXIDINE GLUCONATE 0.12 % MT SOLN
15.0000 mL | Freq: Once | OROMUCOSAL | Status: AC
  Administered 2016-07-27: 15 mL via OROMUCOSAL
  Filled 2016-07-26: qty 15

## 2016-07-27 ENCOUNTER — Encounter (HOSPITAL_COMMUNITY): Payer: Self-pay | Admitting: *Deleted

## 2016-07-27 ENCOUNTER — Inpatient Hospital Stay (HOSPITAL_COMMUNITY): Payer: Medicare Other

## 2016-07-27 ENCOUNTER — Inpatient Hospital Stay (HOSPITAL_COMMUNITY): Payer: Medicare Other | Admitting: Anesthesiology

## 2016-07-27 ENCOUNTER — Inpatient Hospital Stay (HOSPITAL_COMMUNITY)
Admission: RE | Admit: 2016-07-27 | Discharge: 2016-07-31 | DRG: 219 | Disposition: A | Discharge status: Home / Self Care | Payer: Medicare Other | Source: Ambulatory Visit | Attending: Thoracic Surgery (Cardiothoracic Vascular Surgery) | Admitting: Thoracic Surgery (Cardiothoracic Vascular Surgery)

## 2016-07-27 ENCOUNTER — Encounter (HOSPITAL_COMMUNITY)
Admission: RE | Disposition: A | Discharge status: Home / Self Care | Payer: Self-pay | Source: Ambulatory Visit | Attending: Thoracic Surgery (Cardiothoracic Vascular Surgery)

## 2016-07-27 DIAGNOSIS — M5030 Other cervical disc degeneration, unspecified cervical region: Secondary | ICD-10-CM | POA: Diagnosis not present

## 2016-07-27 DIAGNOSIS — K4090 Unilateral inguinal hernia, without obstruction or gangrene, not specified as recurrent: Secondary | ICD-10-CM | POA: Diagnosis present

## 2016-07-27 DIAGNOSIS — K5730 Diverticulosis of large intestine without perforation or abscess without bleeding: Secondary | ICD-10-CM | POA: Diagnosis present

## 2016-07-27 DIAGNOSIS — D6959 Other secondary thrombocytopenia: Secondary | ICD-10-CM | POA: Diagnosis not present

## 2016-07-27 DIAGNOSIS — Z86711 Personal history of pulmonary embolism: Secondary | ICD-10-CM | POA: Diagnosis not present

## 2016-07-27 DIAGNOSIS — Q2112 Patent foramen ovale: Secondary | ICD-10-CM

## 2016-07-27 DIAGNOSIS — E8770 Fluid overload, unspecified: Secondary | ICD-10-CM | POA: Diagnosis not present

## 2016-07-27 DIAGNOSIS — I340 Nonrheumatic mitral (valve) insufficiency: Principal | ICD-10-CM | POA: Diagnosis present

## 2016-07-27 DIAGNOSIS — Z8774 Personal history of (corrected) congenital malformations of heart and circulatory system: Secondary | ICD-10-CM

## 2016-07-27 DIAGNOSIS — N281 Cyst of kidney, acquired: Secondary | ICD-10-CM | POA: Diagnosis present

## 2016-07-27 DIAGNOSIS — D62 Acute posthemorrhagic anemia: Secondary | ICD-10-CM | POA: Diagnosis not present

## 2016-07-27 DIAGNOSIS — Z4682 Encounter for fitting and adjustment of non-vascular catheter: Secondary | ICD-10-CM | POA: Diagnosis not present

## 2016-07-27 DIAGNOSIS — I10 Essential (primary) hypertension: Secondary | ICD-10-CM | POA: Diagnosis present

## 2016-07-27 DIAGNOSIS — K449 Diaphragmatic hernia without obstruction or gangrene: Secondary | ICD-10-CM | POA: Diagnosis not present

## 2016-07-27 DIAGNOSIS — Z9889 Other specified postprocedural states: Secondary | ICD-10-CM

## 2016-07-27 DIAGNOSIS — Z79899 Other long term (current) drug therapy: Secondary | ICD-10-CM

## 2016-07-27 DIAGNOSIS — I511 Rupture of chordae tendineae, not elsewhere classified: Secondary | ICD-10-CM | POA: Diagnosis not present

## 2016-07-27 DIAGNOSIS — J9811 Atelectasis: Secondary | ICD-10-CM | POA: Diagnosis not present

## 2016-07-27 DIAGNOSIS — Z8249 Family history of ischemic heart disease and other diseases of the circulatory system: Secondary | ICD-10-CM

## 2016-07-27 DIAGNOSIS — Q211 Atrial septal defect: Secondary | ICD-10-CM | POA: Diagnosis not present

## 2016-07-27 DIAGNOSIS — I517 Cardiomegaly: Secondary | ICD-10-CM | POA: Diagnosis not present

## 2016-07-27 DIAGNOSIS — I440 Atrioventricular block, first degree: Secondary | ICD-10-CM | POA: Diagnosis present

## 2016-07-27 HISTORY — PX: MITRAL VALVE REPLACEMENT: SHX147

## 2016-07-27 HISTORY — PX: REPAIR OF PATENT FORAMEN OVALE: SHX6064

## 2016-07-27 HISTORY — DX: Personal history of (corrected) congenital malformations of heart and circulatory system: Z87.74

## 2016-07-27 HISTORY — PX: TEE WITHOUT CARDIOVERSION: SHX5443

## 2016-07-27 LAB — POCT I-STAT, CHEM 8
BUN: 15 mg/dL (ref 6–20)
BUN: 12 mg/dL (ref 6–20)
BUN: 15 mg/dL (ref 6–20)
BUN: 12 mg/dL (ref 6–20)
BUN: 13 mg/dL (ref 6–20)
BUN: 13 mg/dL (ref 6–20)
BUN: 12 mg/dL (ref 6–20)
CALCIUM ION: 1.31 mmol/L (ref 1.15–1.40)
CALCIUM ION: 1.04 mmol/L — AB (ref 1.15–1.40)
CALCIUM ION: 1.17 mmol/L (ref 1.15–1.40)
CALCIUM ION: 1.11 mmol/L — AB (ref 1.15–1.40)
CALCIUM ION: 1.19 mmol/L (ref 1.15–1.40)
CHLORIDE: 103 mmol/L (ref 101–111)
CHLORIDE: 107 mmol/L (ref 101–111)
CREATININE: 0.8 mg/dL (ref 0.61–1.24)
CREATININE: 0.9 mg/dL (ref 0.61–1.24)
Calcium, Ion: 1.28 mmol/L (ref 1.15–1.40)
Calcium, Ion: 1.13 mmol/L — ABNORMAL LOW (ref 1.15–1.40)
Chloride: 106 mmol/L (ref 101–111)
Chloride: 102 mmol/L (ref 101–111)
Chloride: 106 mmol/L (ref 101–111)
Chloride: 106 mmol/L (ref 101–111)
Chloride: 106 mmol/L (ref 101–111)
Creatinine, Ser: 0.6 mg/dL — ABNORMAL LOW (ref 0.61–1.24)
Creatinine, Ser: 0.8 mg/dL (ref 0.61–1.24)
Creatinine, Ser: 0.8 mg/dL (ref 0.61–1.24)
Creatinine, Ser: 0.9 mg/dL (ref 0.61–1.24)
Creatinine, Ser: 0.9 mg/dL (ref 0.61–1.24)
GLUCOSE: 104 mg/dL — AB (ref 65–99)
GLUCOSE: 96 mg/dL (ref 65–99)
GLUCOSE: 133 mg/dL — AB (ref 65–99)
GLUCOSE: 127 mg/dL — AB (ref 65–99)
Glucose, Bld: 109 mg/dL — ABNORMAL HIGH (ref 65–99)
Glucose, Bld: 131 mg/dL — ABNORMAL HIGH (ref 65–99)
Glucose, Bld: 137 mg/dL — ABNORMAL HIGH (ref 65–99)
HCT: 29 % — ABNORMAL LOW (ref 39.0–52.0)
HCT: 27 % — ABNORMAL LOW (ref 39.0–52.0)
HCT: 31 % — ABNORMAL LOW (ref 39.0–52.0)
HEMATOCRIT: 36 % — AB (ref 39.0–52.0)
HEMATOCRIT: 35 % — AB (ref 39.0–52.0)
HEMATOCRIT: 27 % — AB (ref 39.0–52.0)
HEMATOCRIT: 28 % — AB (ref 39.0–52.0)
HEMOGLOBIN: 11.9 g/dL — AB (ref 13.0–17.0)
HEMOGLOBIN: 9.9 g/dL — AB (ref 13.0–17.0)
HEMOGLOBIN: 9.2 g/dL — AB (ref 13.0–17.0)
Hemoglobin: 12.2 g/dL — ABNORMAL LOW (ref 13.0–17.0)
Hemoglobin: 9.5 g/dL — ABNORMAL LOW (ref 13.0–17.0)
Hemoglobin: 9.2 g/dL — ABNORMAL LOW (ref 13.0–17.0)
Hemoglobin: 10.5 g/dL — ABNORMAL LOW (ref 13.0–17.0)
POTASSIUM: 3.9 mmol/L (ref 3.5–5.1)
POTASSIUM: 3.7 mmol/L (ref 3.5–5.1)
POTASSIUM: 3.7 mmol/L (ref 3.5–5.1)
POTASSIUM: 4.4 mmol/L (ref 3.5–5.1)
Potassium: 4 mmol/L (ref 3.5–5.1)
Potassium: 4.1 mmol/L (ref 3.5–5.1)
Potassium: 4.1 mmol/L (ref 3.5–5.1)
SODIUM: 142 mmol/L (ref 135–145)
SODIUM: 138 mmol/L (ref 135–145)
SODIUM: 139 mmol/L (ref 135–145)
SODIUM: 139 mmol/L (ref 135–145)
Sodium: 141 mmol/L (ref 135–145)
Sodium: 140 mmol/L (ref 135–145)
Sodium: 139 mmol/L (ref 135–145)
TCO2: 28 mmol/L (ref 0–100)
TCO2: 26 mmol/L (ref 0–100)
TCO2: 29 mmol/L (ref 0–100)
TCO2: 28 mmol/L (ref 0–100)
TCO2: 27 mmol/L (ref 0–100)
TCO2: 22 mmol/L (ref 0–100)
TCO2: 23 mmol/L (ref 0–100)

## 2016-07-27 LAB — GLUCOSE, CAPILLARY
Glucose-Capillary: 100 mg/dL — ABNORMAL HIGH (ref 65–99)
Glucose-Capillary: 96 mg/dL (ref 65–99)

## 2016-07-27 LAB — CBC
HEMATOCRIT: 36.9 % — AB (ref 39.0–52.0)
HEMATOCRIT: 34.4 % — AB (ref 39.0–52.0)
HEMOGLOBIN: 12.7 g/dL — AB (ref 13.0–17.0)
HEMOGLOBIN: 11.9 g/dL — AB (ref 13.0–17.0)
MCH: 29.1 pg (ref 26.0–34.0)
MCH: 29.3 pg (ref 26.0–34.0)
MCHC: 34.4 g/dL (ref 30.0–36.0)
MCHC: 34.6 g/dL (ref 30.0–36.0)
MCV: 84.6 fL (ref 78.0–100.0)
MCV: 84.7 fL (ref 78.0–100.0)
Platelets: 139 10*3/uL — ABNORMAL LOW (ref 150–400)
Platelets: 123 10*3/uL — ABNORMAL LOW (ref 150–400)
RBC: 4.36 MIL/uL (ref 4.22–5.81)
RBC: 4.06 MIL/uL — AB (ref 4.22–5.81)
RDW: 13.3 % (ref 11.5–15.5)
RDW: 13.2 % (ref 11.5–15.5)
WBC: 11.4 10*3/uL — ABNORMAL HIGH (ref 4.0–10.5)
WBC: 9.3 10*3/uL (ref 4.0–10.5)

## 2016-07-27 LAB — POCT I-STAT 3, ART BLOOD GAS (G3+)
ACID-BASE DEFICIT: 4 mmol/L — AB (ref 0.0–2.0)
Acid-Base Excess: 3 mmol/L — ABNORMAL HIGH (ref 0.0–2.0)
Acid-base deficit: 3 mmol/L — ABNORMAL HIGH (ref 0.0–2.0)
BICARBONATE: 22.4 mmol/L (ref 20.0–28.0)
Bicarbonate: 26.4 mmol/L (ref 20.0–28.0)
Bicarbonate: 28.5 mmol/L — ABNORMAL HIGH (ref 20.0–28.0)
Bicarbonate: 21.2 mmol/L (ref 20.0–28.0)
O2 SAT: 100 %
O2 SAT: 98 %
O2 SAT: 96 %
O2 Saturation: 100 %
PCO2 ART: 47 mmHg (ref 32.0–48.0)
PCO2 ART: 34.8 mmHg (ref 32.0–48.0)
PH ART: 7.391 (ref 7.350–7.450)
PO2 ART: 371 mmHg — AB (ref 83.0–108.0)
PO2 ART: 80 mmHg — AB (ref 83.0–108.0)
Patient temperature: 35.6
TCO2: 28 mmol/L (ref 0–100)
TCO2: 30 mmol/L (ref 0–100)
TCO2: 22 mmol/L (ref 0–100)
TCO2: 24 mmol/L (ref 0–100)
pCO2 arterial: 49.2 mmHg — ABNORMAL HIGH (ref 32.0–48.0)
pCO2 arterial: 42.4 mmHg (ref 32.0–48.0)
pH, Arterial: 7.338 — ABNORMAL LOW (ref 7.350–7.450)
pH, Arterial: 7.392 (ref 7.350–7.450)
pH, Arterial: 7.324 — ABNORMAL LOW (ref 7.350–7.450)
pO2, Arterial: 534 mmHg — ABNORMAL HIGH (ref 83.0–108.0)
pO2, Arterial: 106 mmHg (ref 83.0–108.0)

## 2016-07-27 LAB — POCT I-STAT 4, (NA,K, GLUC, HGB,HCT)
Glucose, Bld: 107 mg/dL — ABNORMAL HIGH (ref 65–99)
HCT: 33 % — ABNORMAL LOW (ref 39.0–52.0)
Hemoglobin: 11.2 g/dL — ABNORMAL LOW (ref 13.0–17.0)
Potassium: 4.1 mmol/L (ref 3.5–5.1)
SODIUM: 141 mmol/L (ref 135–145)

## 2016-07-27 LAB — APTT: APTT: 36 s (ref 24–36)

## 2016-07-27 LAB — PROTIME-INR
INR: 1.32
Prothrombin Time: 16.4 seconds — ABNORMAL HIGH (ref 11.4–15.2)

## 2016-07-27 LAB — CREATININE, SERUM
Creatinine, Ser: 1.03 mg/dL (ref 0.61–1.24)
GFR calc non Af Amer: >60 mL/min (ref >60)

## 2016-07-27 LAB — PLATELET COUNT: Platelets: 120 10*3/uL — ABNORMAL LOW (ref 150–400)

## 2016-07-27 LAB — HEMOGLOBIN AND HEMATOCRIT, BLOOD
HEMATOCRIT: 29.1 % — AB (ref 39.0–52.0)
HEMOGLOBIN: 10 g/dL — AB (ref 13.0–17.0)

## 2016-07-27 LAB — MAGNESIUM: MAGNESIUM: 3.1 mg/dL — AB (ref 1.7–2.4)

## 2016-07-27 SURGERY — REPLACEMENT, MITRAL VALVE, MINIMALLY INVASIVE
Anesthesia: General | Site: Chest | Laterality: Right

## 2016-07-27 MED ORDER — ACETAMINOPHEN 500 MG PO TABS
1000.0000 mg | ORAL_TABLET | Freq: Four times a day (QID) | ORAL | Status: DC
  Administered 2016-07-28 – 2016-07-31 (×11): 1000 mg via ORAL
  Filled 2016-07-27 (×11): qty 2

## 2016-07-27 MED ORDER — FAMOTIDINE IN NACL 20-0.9 MG/50ML-% IV SOLN
20.0000 mg | Freq: Two times a day (BID) | INTRAVENOUS | Status: AC
  Administered 2016-07-27 (×2): 20 mg via INTRAVENOUS
  Filled 2016-07-27: qty 50

## 2016-07-27 MED ORDER — METOPROLOL TARTRATE 5 MG/5ML IV SOLN: 2.5000 mg | INTRAVENOUS | Status: DC | PRN

## 2016-07-27 MED ORDER — PHENYLEPHRINE HCL 10 MG/ML IJ SOLN
INTRAVENOUS | Status: DC | PRN
  Administered 2016-07-27: 10 ug/min via INTRAVENOUS

## 2016-07-27 MED ORDER — ACETAMINOPHEN 160 MG/5ML PO SOLN: 650.0000 mg | Freq: Once | ORAL | Status: AC

## 2016-07-27 MED ORDER — VANCOMYCIN HCL IN DEXTROSE 1-5 GM/200ML-% IV SOLN
1000.0000 mg | Freq: Once | INTRAVENOUS | Status: AC
  Administered 2016-07-27: 1000 mg via INTRAVENOUS
  Filled 2016-07-27: qty 200

## 2016-07-27 MED ORDER — LACTATED RINGERS IV SOLN
INTRAVENOUS | Status: DC | PRN
  Administered 2016-07-27: 07:00:00 via INTRAVENOUS

## 2016-07-27 MED ORDER — SODIUM CHLORIDE 0.9 % IV SOLN
INTRAVENOUS | Status: DC
  Filled 2016-07-27: qty 2.5

## 2016-07-27 MED ORDER — MORPHINE SULFATE (PF) 2 MG/ML IV SOLN: 1.0000 mg | INTRAVENOUS | Status: DC | PRN

## 2016-07-27 MED ORDER — ROCURONIUM BROMIDE 10 MG/ML (PF) SYRINGE
PREFILLED_SYRINGE | INTRAVENOUS | Status: AC
  Filled 2016-07-27: qty 5

## 2016-07-27 MED ORDER — PROPOFOL 10 MG/ML IV BOLUS
INTRAVENOUS | Status: AC
  Filled 2016-07-27: qty 20

## 2016-07-27 MED ORDER — DEXMEDETOMIDINE HCL 200 MCG/2ML IV SOLN
INTRAVENOUS | Status: DC | PRN
  Administered 2016-07-27: 0.2 ug/kg/h via INTRAVENOUS

## 2016-07-27 MED ORDER — NITROGLYCERIN IN D5W 200-5 MCG/ML-% IV SOLN: 0.0000 ug/min | INTRAVENOUS | Status: DC

## 2016-07-27 MED ORDER — HEPARIN SODIUM (PORCINE) 1000 UNIT/ML IJ SOLN
INTRAMUSCULAR | Status: DC | PRN
  Administered 2016-07-27: 27 mL via INTRAVENOUS

## 2016-07-27 MED ORDER — ONDANSETRON HCL 4 MG/2ML IJ SOLN: 4.0000 mg | Freq: Four times a day (QID) | INTRAMUSCULAR | Status: DC | PRN

## 2016-07-27 MED ORDER — ACETAMINOPHEN 160 MG/5ML PO SOLN: 1000.0000 mg | Freq: Four times a day (QID) | ORAL | Status: DC

## 2016-07-27 MED ORDER — SODIUM CHLORIDE 0.9 % IJ SOLN
INTRAMUSCULAR | Status: AC
  Filled 2016-07-27: qty 10

## 2016-07-27 MED ORDER — ASPIRIN 81 MG PO CHEW: 324.0000 mg | CHEWABLE_TABLET | Freq: Every day | ORAL | Status: DC

## 2016-07-27 MED ORDER — SODIUM CHLORIDE 0.9 % IV SOLN: INTRAVENOUS | Status: DC

## 2016-07-27 MED ORDER — ASPIRIN EC 325 MG PO TBEC
325.0000 mg | DELAYED_RELEASE_TABLET | Freq: Every day | ORAL | Status: DC
  Administered 2016-07-28: 325 mg via ORAL
  Filled 2016-07-27: qty 1

## 2016-07-27 MED ORDER — SODIUM CHLORIDE 0.9 % IV SOLN
30.0000 meq | Freq: Once | INTRAVENOUS | Status: AC
Start: 2016-07-27 — End: 2016-07-28
  Administered 2016-07-28: 30 meq via INTRAVENOUS
  Filled 2016-07-27 (×2): qty 15

## 2016-07-27 MED ORDER — METOPROLOL TARTRATE 12.5 MG HALF TABLET
12.5000 mg | ORAL_TABLET | Freq: Two times a day (BID) | ORAL | Status: DC
  Administered 2016-07-28 – 2016-07-31 (×7): 12.5 mg via ORAL
  Filled 2016-07-27 (×7): qty 1

## 2016-07-27 MED ORDER — SODIUM CHLORIDE 0.9 % IV SOLN: 250.0000 mL | INTRAVENOUS | Status: DC

## 2016-07-27 MED ORDER — LACTATED RINGERS IV SOLN: INTRAVENOUS | Status: DC

## 2016-07-27 MED ORDER — MAGNESIUM SULFATE 4 GM/100ML IV SOLN
4.0000 g | Freq: Once | INTRAVENOUS | Status: AC
  Administered 2016-07-27: 4 g via INTRAVENOUS
  Filled 2016-07-27: qty 100

## 2016-07-27 MED ORDER — OXYCODONE HCL 5 MG PO TABS: 5.0000 mg | ORAL_TABLET | ORAL | Status: DC | PRN

## 2016-07-27 MED ORDER — BISACODYL 10 MG RE SUPP: 10.0000 mg | Freq: Every day | RECTAL | Status: DC

## 2016-07-27 MED ORDER — LACTATED RINGERS IV SOLN
500.0000 mL | Freq: Once | INTRAVENOUS | Status: DC | PRN
Start: 2016-07-27 — End: 2016-07-28

## 2016-07-27 MED ORDER — SODIUM CHLORIDE 0.9 % IV SOLN
INTRAVENOUS | Status: AC
  Administered 2016-07-27: 15:00:00 via INTRAVENOUS

## 2016-07-27 MED ORDER — ACETAMINOPHEN 650 MG RE SUPP
650.0000 mg | Freq: Once | RECTAL | Status: AC
  Administered 2016-07-27: 650 mg via RECTAL

## 2016-07-27 MED ORDER — FENTANYL CITRATE (PF) 250 MCG/5ML IJ SOLN
INTRAMUSCULAR | Status: AC
  Filled 2016-07-27: qty 5

## 2016-07-27 MED ORDER — MIDAZOLAM HCL 10 MG/2ML IJ SOLN
INTRAMUSCULAR | Status: AC
  Filled 2016-07-27: qty 2

## 2016-07-27 MED ORDER — SODIUM CHLORIDE 0.9% FLUSH
3.0000 mL | Freq: Two times a day (BID) | INTRAVENOUS | Status: DC
  Administered 2016-07-28 – 2016-07-30 (×4): 3 mL via INTRAVENOUS

## 2016-07-27 MED ORDER — ARTIFICIAL TEARS OPHTHALMIC OINT
TOPICAL_OINTMENT | OPHTHALMIC | Status: DC | PRN
  Administered 2016-07-27: 1 via OPHTHALMIC

## 2016-07-27 MED ORDER — PANTOPRAZOLE SODIUM 40 MG PO TBEC
40.0000 mg | DELAYED_RELEASE_TABLET | Freq: Every day | ORAL | Status: DC
  Administered 2016-07-29 – 2016-07-31 (×3): 40 mg via ORAL
  Filled 2016-07-27 (×3): qty 1

## 2016-07-27 MED ORDER — MIDAZOLAM HCL 2 MG/2ML IJ SOLN: 2.0000 mg | INTRAMUSCULAR | Status: DC | PRN

## 2016-07-27 MED ORDER — BISACODYL 5 MG PO TBEC
10.0000 mg | DELAYED_RELEASE_TABLET | Freq: Every day | ORAL | Status: DC
  Administered 2016-07-28: 10 mg via ORAL
  Filled 2016-07-27 (×3): qty 2

## 2016-07-27 MED ORDER — SUCCINYLCHOLINE CHLORIDE 200 MG/10ML IV SOSY
PREFILLED_SYRINGE | INTRAVENOUS | Status: AC
  Filled 2016-07-27: qty 10

## 2016-07-27 MED ORDER — LIDOCAINE 2% (20 MG/ML) 5 ML SYRINGE
INTRAMUSCULAR | Status: AC
  Filled 2016-07-27: qty 5

## 2016-07-27 MED ORDER — PROTAMINE SULFATE 10 MG/ML IV SOLN
INTRAVENOUS | Status: AC
  Filled 2016-07-27: qty 25

## 2016-07-27 MED ORDER — SODIUM CHLORIDE 0.9 % IV SOLN
INTRAVENOUS | Status: DC | PRN
  Administered 2016-07-27: .9 [IU]/h via INTRAVENOUS

## 2016-07-27 MED ORDER — PHENYLEPHRINE HCL 10 MG/ML IJ SOLN: INTRAMUSCULAR | Status: DC | PRN

## 2016-07-27 MED ORDER — BUPIVACAINE HCL (PF) 0.25 % IJ SOLN
INTRAMUSCULAR | Status: AC
  Filled 2016-07-27: qty 10

## 2016-07-27 MED ORDER — BUPIVACAINE 0.25 % ON-Q PUMP SINGLE CATH 400 ML
400.0000 mL | INJECTION | Status: DC
  Filled 2016-07-27: qty 400

## 2016-07-27 MED ORDER — SODIUM CHLORIDE 0.9 % IV SOLN
0.0000 ug/min | INTRAVENOUS | Status: DC
  Administered 2016-07-27: 5 ug/min via INTRAVENOUS
  Filled 2016-07-27 (×2): qty 2

## 2016-07-27 MED ORDER — MORPHINE SULFATE (PF) 4 MG/ML IV SOLN: 1.0000 mg | INTRAVENOUS | Status: DC | PRN

## 2016-07-27 MED ORDER — ORAL CARE MOUTH RINSE
15.0000 mL | OROMUCOSAL | Status: DC
  Administered 2016-07-27 (×2): 15 mL via OROMUCOSAL

## 2016-07-27 MED ORDER — SODIUM CHLORIDE 0.45 % IV SOLN
INTRAVENOUS | Status: DC | PRN
  Administered 2016-07-27: 15:00:00 via INTRAVENOUS

## 2016-07-27 MED ORDER — PROTAMINE SULFATE 10 MG/ML IV SOLN
INTRAVENOUS | Status: AC
  Filled 2016-07-27: qty 5

## 2016-07-27 MED ORDER — CHLORHEXIDINE GLUCONATE 0.12 % MT SOLN
15.0000 mL | OROMUCOSAL | Status: AC
  Administered 2016-07-27: 15 mL via OROMUCOSAL

## 2016-07-27 MED ORDER — FENTANYL CITRATE (PF) 250 MCG/5ML IJ SOLN
INTRAMUSCULAR | Status: AC
  Filled 2016-07-27: qty 20

## 2016-07-27 MED ORDER — SODIUM CHLORIDE 0.9% FLUSH: 3.0000 mL | INTRAVENOUS | Status: DC | PRN

## 2016-07-27 MED ORDER — FENTANYL CITRATE (PF) 250 MCG/5ML IJ SOLN
INTRAMUSCULAR | Status: DC | PRN
  Administered 2016-07-27: 200 ug via INTRAVENOUS
  Administered 2016-07-27: 150 ug via INTRAVENOUS
  Administered 2016-07-27: 250 ug via INTRAVENOUS
  Administered 2016-07-27: 100 ug via INTRAVENOUS
  Administered 2016-07-27: 50 ug via INTRAVENOUS
  Administered 2016-07-27: 100 ug via INTRAVENOUS
  Administered 2016-07-27 (×2): 150 ug via INTRAVENOUS
  Administered 2016-07-27: 100 ug via INTRAVENOUS

## 2016-07-27 MED ORDER — ROCURONIUM BROMIDE 10 MG/ML (PF) SYRINGE
PREFILLED_SYRINGE | INTRAVENOUS | Status: AC
  Filled 2016-07-27: qty 10

## 2016-07-27 MED ORDER — BUPIVACAINE HCL 0.5 % IJ SOLN
INTRAMUSCULAR | Status: DC | PRN
  Administered 2016-07-27: 5 mL

## 2016-07-27 MED ORDER — MIDAZOLAM HCL 5 MG/5ML IJ SOLN
INTRAMUSCULAR | Status: DC | PRN
  Administered 2016-07-27: 2 mg via INTRAVENOUS
  Administered 2016-07-27: 5 mg via INTRAVENOUS
  Administered 2016-07-27: 3 mg via INTRAVENOUS

## 2016-07-27 MED ORDER — HEPARIN SODIUM (PORCINE) 1000 UNIT/ML IJ SOLN
INTRAMUSCULAR | Status: AC
Start: 2016-07-27 — End: 2016-07-27
  Filled 2016-07-27: qty 1

## 2016-07-27 MED ORDER — TRAMADOL HCL 50 MG PO TABS: 50.0000 mg | ORAL_TABLET | ORAL | Status: DC | PRN

## 2016-07-27 MED ORDER — LEVOFLOXACIN IN D5W 750 MG/150ML IV SOLN
750.0000 mg | INTRAVENOUS | Status: AC
  Administered 2016-07-28: 750 mg via INTRAVENOUS
  Filled 2016-07-27: qty 150

## 2016-07-27 MED ORDER — PHENYLEPHRINE 40 MCG/ML (10ML) SYRINGE FOR IV PUSH (FOR BLOOD PRESSURE SUPPORT)
PREFILLED_SYRINGE | INTRAVENOUS | Status: AC
  Filled 2016-07-27: qty 10

## 2016-07-27 MED ORDER — INSULIN ASPART 100 UNIT/ML ~~LOC~~ SOLN
0.0000 [IU] | SUBCUTANEOUS | Status: DC
  Administered 2016-07-27 – 2016-07-28 (×2): 2 [IU] via SUBCUTANEOUS

## 2016-07-27 MED ORDER — PROTAMINE SULFATE 10 MG/ML IV SOLN
INTRAVENOUS | Status: DC | PRN
  Administered 2016-07-27: 110 mg via INTRAVENOUS
  Administered 2016-07-27: 10 mg via INTRAVENOUS
  Administered 2016-07-27 (×3): 50 mg via INTRAVENOUS

## 2016-07-27 MED ORDER — SODIUM CHLORIDE 0.9 % IV SOLN
0.0000 ug/kg/h | INTRAVENOUS | Status: DC
  Administered 2016-07-27: 0.4 ug/kg/h via INTRAVENOUS
  Filled 2016-07-27 (×2): qty 2

## 2016-07-27 MED ORDER — DOCUSATE SODIUM 100 MG PO CAPS
200.0000 mg | ORAL_CAPSULE | Freq: Every day | ORAL | Status: DC
  Administered 2016-07-28: 200 mg via ORAL
  Filled 2016-07-27 (×2): qty 2

## 2016-07-27 MED ORDER — INSULIN REGULAR BOLUS VIA INFUSION
0.0000 [IU] | Freq: Three times a day (TID) | INTRAVENOUS | Status: DC
  Filled 2016-07-27: qty 10

## 2016-07-27 MED ORDER — EPHEDRINE SULFATE 50 MG/ML IJ SOLN
INTRAMUSCULAR | Status: DC | PRN
  Administered 2016-07-27: 15 mg via INTRAVENOUS
  Administered 2016-07-27: 10 mg via INTRAVENOUS
  Administered 2016-07-27: 5 mg via INTRAVENOUS

## 2016-07-27 MED ORDER — ALBUMIN HUMAN 5 % IV SOLN
250.0000 mL | INTRAVENOUS | Status: AC | PRN
  Administered 2016-07-27 (×2): 250 mL via INTRAVENOUS

## 2016-07-27 MED ORDER — CHLORHEXIDINE GLUCONATE 0.12% ORAL RINSE (MEDLINE KIT)
15.0000 mL | Freq: Two times a day (BID) | OROMUCOSAL | Status: DC
  Administered 2016-07-27 – 2016-07-28 (×2): 15 mL via OROMUCOSAL

## 2016-07-27 MED ORDER — PHENYLEPHRINE HCL 10 MG/ML IJ SOLN
INTRAVENOUS | Status: DC | PRN
  Administered 2016-07-27: 20 ug/min via INTRAVENOUS

## 2016-07-27 MED ORDER — 0.9 % SODIUM CHLORIDE (POUR BTL) OPTIME
TOPICAL | Status: DC | PRN
  Administered 2016-07-27: 5000 mL

## 2016-07-27 MED ORDER — ROCURONIUM BROMIDE 100 MG/10ML IV SOLN
INTRAVENOUS | Status: DC | PRN
  Administered 2016-07-27 (×6): 50 mg via INTRAVENOUS

## 2016-07-27 MED ORDER — METOPROLOL TARTRATE 25 MG/10 ML ORAL SUSPENSION: 12.5000 mg | Freq: Two times a day (BID) | ORAL | Status: DC

## 2016-07-27 MED FILL — Heparin Sodium (Porcine) Inj 1000 Unit/ML: INTRAMUSCULAR | Qty: 30 | Status: AC

## 2016-07-27 MED FILL — Potassium Chloride Inj 2 mEq/ML: INTRAVENOUS | Qty: 40 | Status: AC

## 2016-07-27 MED FILL — Magnesium Sulfate Inj 50%: INTRAMUSCULAR | Qty: 10 | Status: AC

## 2016-07-27 SURGICAL SUPPLY — 83 items
ADAPTER CARDIO PERF ANTE/RETRO (ADAPTER) ×3 IMPLANT
BAG DECANTER FOR FLEXI CONT (MISCELLANEOUS) ×3 IMPLANT
BLADE SURG 11 STRL SS (BLADE) ×3 IMPLANT
CANISTER SUCT 3000ML PPV (MISCELLANEOUS) ×3 IMPLANT
CANNULA FEM VENOUS REMOTE 22FR (CANNULA) ×3 IMPLANT
CANNULA FEMORAL ART 14 SM (MISCELLANEOUS) ×3 IMPLANT
CANNULA GUNDRY RCSP 15FR (MISCELLANEOUS) ×3 IMPLANT
CANNULA OPTISITE PERFUSION 18F (CANNULA) ×3 IMPLANT
CANNULA SUMP PERICARDIAL (CANNULA) ×6 IMPLANT
CATH KIT ON-Q SILVERSOAK 5IN (CATHETERS) ×3 IMPLANT
CONN ST 1/4X3/8  BEN (MISCELLANEOUS) ×2
CONN ST 1/4X3/8 BEN (MISCELLANEOUS) ×4 IMPLANT
CONNECTOR 1/2X3/8X1/2 3 WAY (MISCELLANEOUS) ×1
CONNECTOR 1/2X3/8X1/2 3WAY (MISCELLANEOUS) ×2 IMPLANT
CONT SPEC 4OZ CLIKSEAL STRL BL (MISCELLANEOUS) ×3 IMPLANT
COVER BACK TABLE 24X17X13 BIG (DRAPES) ×3 IMPLANT
CRADLE DONUT ADULT HEAD (MISCELLANEOUS) ×3 IMPLANT
DERMABOND ADVANCED (GAUZE/BANDAGES/DRESSINGS) ×1
DERMABOND ADVANCED .7 DNX12 (GAUZE/BANDAGES/DRESSINGS) ×2 IMPLANT
DEVICE SUT CK QUICK LOAD INDV (Prosthesis & Implant Heart) ×9 IMPLANT
DEVICE SUT CK QUICK LOAD MINI (Prosthesis & Implant Heart) ×6 IMPLANT
DEVICE TROCAR PUNCTURE CLOSURE (ENDOMECHANICALS) ×3 IMPLANT
DRAIN CHANNEL 28F RND 3/8 FF (WOUND CARE) ×6 IMPLANT
DRAPE BILATERAL SPLIT (DRAPES) ×3 IMPLANT
DRAPE C-ARM 42X72 X-RAY (DRAPES) ×3 IMPLANT
DRAPE CV SPLIT W-CLR ANES SCRN (DRAPES) ×3 IMPLANT
DRAPE INCISE IOBAN 66X45 STRL (DRAPES) ×6 IMPLANT
DRAPE SLUSH/WARMER DISC (DRAPES) ×3 IMPLANT
DRSG COVADERM 4X8 (GAUZE/BANDAGES/DRESSINGS) ×3 IMPLANT
ELECT BLADE 6.5 EXT (BLADE) ×3 IMPLANT
ELECT REM PT RETURN 9FT ADLT (ELECTROSURGICAL) ×6
ELECTRODE REM PT RTRN 9FT ADLT (ELECTROSURGICAL) ×4 IMPLANT
FELT TEFLON 1X6 (MISCELLANEOUS) ×6 IMPLANT
FEMORAL VENOUS CANN RAP (CANNULA) ×3 IMPLANT
GAUZE SPONGE 4X4 12PLY STRL (GAUZE/BANDAGES/DRESSINGS) ×3 IMPLANT
GAUZE SPONGE 4X4 12PLY STRL LF (GAUZE/BANDAGES/DRESSINGS) ×3 IMPLANT
GLOVE ORTHO TXT STRL SZ7.5 (GLOVE) ×9 IMPLANT
GOWN STRL REUS W/ TWL LRG LVL3 (GOWN DISPOSABLE) ×12 IMPLANT
GOWN STRL REUS W/TWL LRG LVL3 (GOWN DISPOSABLE) ×6
KIT BASIN OR (CUSTOM PROCEDURE TRAY) ×3 IMPLANT
KIT DEVICE SUT COR-KNOT MIS 5 (INSTRUMENTS) ×6 IMPLANT
KIT DILATOR VASC 18G NDL (KITS) ×3 IMPLANT
KIT DRAINAGE VACCUM ASSIST (KITS) ×3 IMPLANT
KIT ROOM TURNOVER OR (KITS) ×3 IMPLANT
KIT SUCTION CATH 14FR (SUCTIONS) ×3 IMPLANT
KIT SUT CK MINI COMBO 4X17 (Prosthesis & Implant Heart) ×3 IMPLANT
LEAD PACING MYOCARDI (MISCELLANEOUS) ×3 IMPLANT
LINE VENT (MISCELLANEOUS) ×3 IMPLANT
NEEDLE AORTIC ROOT 14G 7F (CATHETERS) ×3 IMPLANT
NS IRRIG 1000ML POUR BTL (IV SOLUTION) ×15 IMPLANT
PACK OPEN HEART (CUSTOM PROCEDURE TRAY) ×3 IMPLANT
PAD ARMBOARD 7.5X6 YLW CONV (MISCELLANEOUS) ×6 IMPLANT
PAD ELECT DEFIB RADIOL ZOLL (MISCELLANEOUS) ×3 IMPLANT
RING MITRAL MEMO 3D 38MM SMD38 (Prosthesis & Implant Heart) ×3 IMPLANT
SET CANNULATION TOURNIQUET (MISCELLANEOUS) ×3 IMPLANT
SET CARDIOPLEGIA MPS 5001102 (MISCELLANEOUS) ×3 IMPLANT
SET IRRIG TUBING LAPAROSCOPIC (IRRIGATION / IRRIGATOR) ×3 IMPLANT
SOLUTION ANTI FOG 6CC (MISCELLANEOUS) ×3 IMPLANT
SUT BONE WAX W31G (SUTURE) ×3 IMPLANT
SUT E-PACK MINIMALLY INVASIVE (SUTURE) ×3 IMPLANT
SUT ETHIBOND (SUTURE) ×6 IMPLANT
SUT ETHIBOND 2 0 SH (SUTURE) ×3 IMPLANT
SUT ETHIBOND 2-0 RB-1 WHT (SUTURE) ×6 IMPLANT
SUT ETHIBOND X763 2 0 SH 1 (SUTURE) IMPLANT
SUT GORETEX CV 4 TH 22 36 (SUTURE) ×3 IMPLANT
SUT GORETEX CV-5THC-13 36IN (SUTURE) ×33 IMPLANT
SUT GORETEX CV4 TH-18 (SUTURE) ×6 IMPLANT
SUT PTFE CHORD X 24MM (SUTURE) ×6 IMPLANT
SYR 10ML LL (SYRINGE) ×3 IMPLANT
SYSTEM SAHARA CHEST DRAIN ATS (WOUND CARE) ×6 IMPLANT
TAPE CLOTH SURG 4X10 WHT LF (GAUZE/BANDAGES/DRESSINGS) ×3 IMPLANT
TOWEL GREEN STERILE (TOWEL DISPOSABLE) IMPLANT
TOWEL GREEN STERILE FF (TOWEL DISPOSABLE) IMPLANT
TOWEL OR 17X24 6PK STRL BLUE (TOWEL DISPOSABLE) ×3 IMPLANT
TOWEL OR 17X26 10 PK STRL BLUE (TOWEL DISPOSABLE) ×3 IMPLANT
TRAY FOLEY SILVER 16FR TEMP (SET/KITS/TRAYS/PACK) ×3 IMPLANT
TROCAR XCEL BLADELESS 5X75MML (TROCAR) ×3 IMPLANT
TROCAR XCEL NON-BLD 11X100MML (ENDOMECHANICALS) ×6 IMPLANT
TUBE SUCT INTRACARD DLP 20F (MISCELLANEOUS) ×3 IMPLANT
TUNNELER SHEATH ON-Q 11GX8 DSP (PAIN MANAGEMENT) ×3 IMPLANT
UNDERPAD 30X30 (UNDERPADS AND DIAPERS) ×3 IMPLANT
WATER STERILE IRR 1000ML POUR (IV SOLUTION) ×6 IMPLANT
WIRE .035 3MM-J 145CM (WIRE) ×3 IMPLANT

## 2016-07-27 NOTE — Anesthesia Preprocedure Evaluation (Addendum)
Anesthesia Evaluation  Patient identified by MRN, date of birth, ID band Patient awake    Reviewed: Allergy & Precautions, NPO status , Patient's Chart, lab work & pertinent test results  History of Anesthesia Complications Negative for: history of anesthetic complications  Airway Mallampati: I  TM Distance: >3 FB Neck ROM: Full    Dental  (+) Teeth Intact, Dental Advisory Given   Pulmonary    breath sounds clear to auscultation       Cardiovascular + Valvular Problems/Murmurs MR and MVP  Rhythm:Regular Rate:Normal + Systolic murmurs    Neuro/Psych    GI/Hepatic   Endo/Other    Renal/GU      Musculoskeletal   Abdominal   Peds  Hematology   Anesthesia Other Findings   Reproductive/Obstetrics                          Anesthesia Physical Anesthesia Plan  ASA: III  Anesthesia Plan: General   Post-op Pain Management:    Induction: Intravenous  Airway Management Planned: Oral ETT  Additional Equipment: Arterial line, PA Cath and 3D TEE  Intra-op Plan:   Post-operative Plan: Post-operative intubation/ventilation  Informed Consent: I have reviewed the patients History and Physical, chart, labs and discussed the procedure including the risks, benefits and alternatives for the proposed anesthesia with the patient or authorized representative who has indicated his/her understanding and acceptance.   Dental advisory given  Plan Discussed with: CRNA and Anesthesiologist  Anesthesia Plan Comments:         Anesthesia Quick Evaluation

## 2016-07-27 NOTE — Progress Notes (Signed)
07/27/2016 1530  ABG on arrival to SICU reviewed with Dr. [name]. Verbal order received to increase RR on ventilator to 16 bpm. Orders enacted. RT updated on changes. Will continue to closely monitor patient.  Jeremy Fisher, Jeremy Fisher

## 2016-07-27 NOTE — Anesthesia Procedure Notes (Addendum)
Central Venous Catheter Insertion Performed by: Kipp Brood, anesthesiologist Start/End4/25/2018 7:00 AM, 07/27/2016 7:05 AM Patient location: OR. Preanesthetic checklist: patient identified, IV checked, site marked, risks and benefits discussed, surgical consent, monitors and equipment checked, pre-op evaluation, timeout performed and anesthesia consent Lidocaine 1% used for infiltration and patient sedated Hand hygiene performed  and maximum sterile barriers used  Catheter size: 8 Fr Total catheter length 16. Central line was placed.Double lumen Procedure performed using ultrasound guided technique. Ultrasound Notes:image(s) printed for medical record Attempts: 1 Following insertion, dressing applied and line sutured. Post procedure assessment: blood return through all ports  Patient tolerated the procedure well with no immediate complications.

## 2016-07-27 NOTE — Anesthesia Procedure Notes (Signed)
Central Venous Catheter Insertion Performed by: Kipp Brood, anesthesiologist Start/End4/25/2018 6:50 AM, 07/27/2016 6:55 AM Patient location: Pre-op. Preanesthetic checklist: patient identified, IV checked, site marked, risks and benefits discussed, surgical consent, monitors and equipment checked, pre-op evaluation and timeout performed Position: supine Hand hygiene performed , maximum sterile barriers used  and Seldinger technique used Catheter size: 8.5 Fr PA cath was placed.Procedure performed using ultrasound guided technique. Ultrasound Notes:anatomy identified, needle tip was noted to be adjacent to the nerve/plexus identified, no ultrasound evidence of intravascular and/or intraneural injection and image(s) printed for medical record Attempts: 1 Following insertion, line sutured and dressing applied. Post procedure assessment: blood return through all ports, free fluid flow and no air  Patient tolerated the procedure well with no immediate complications.

## 2016-07-27 NOTE — Anesthesia Procedure Notes (Signed)
Procedure Name: Intubation Date/Time: 07/27/2016 8:08 AM Performed by: Trixie Deis A Pre-anesthesia Checklist: Patient identified, Emergency Drugs available, Suction available, Patient being monitored and Timeout performed Patient Re-evaluated:Patient Re-evaluated prior to inductionOxygen Delivery Method: Circle system utilized Preoxygenation: Pre-oxygenation with 100% oxygen Intubation Type: IV induction Ventilation: Mask ventilation without difficulty and Oral airway inserted - appropriate to patient size Laryngoscope Size: Mac and 4 Grade View: Grade I Endobronchial tube: Left and 37 Fr Number of attempts: 1 Airway Equipment and Method: Rigid stylet and Fiberoptic brochoscope Placement Confirmation: ETT inserted through vocal cords under direct vision,  positive ETCO2 and breath sounds checked- equal and bilateral Secured at: 31 cm Tube secured with: Tape Dental Injury: Teeth and Oropharynx as per pre-operative assessment

## 2016-07-27 NOTE — Interval H&P Note (Signed)
History and Physical Interval Note:  07/27/2016 6:58 AM  Jeremy Fisher  has presented today for surgery, with the diagnosis of SEVERE MR  The various methods of treatment have been discussed with the patient and family. After consideration of risks, benefits and other options for treatment, the patient has consented to  Procedure(s): MINIMALLY INVASIVE MITRAL VALVE (MV) REPAIR (Right) TRANSESOPHAGEAL ECHOCARDIOGRAM (TEE) (N/A) REPAIR OF PATENT FORAMEN OVALE (N/A) as a surgical intervention .  The patient's history has been reviewed, patient examined, no change in status, stable for surgery.  I have reviewed the patient's chart and labs.  Questions were answered to the patient's satisfaction.     Purcell Nails

## 2016-07-27 NOTE — Anesthesia Procedure Notes (Signed)
Procedure Name: Intubation Date/Time: 07/27/2016 2:25 PM Performed by: Kipp Brood Pre-anesthesia Checklist: Patient identified, Emergency Drugs available, Suction available and Patient being monitored Patient Re-evaluated:Patient Re-evaluated prior to inductionOxygen Delivery Method: Circle System Utilized Preoxygenation: Pre-oxygenation with 100% oxygen Intubation Type: Inhalational induction with existing ETT Ventilation: Mask ventilation without difficulty Laryngoscope Size: Glidescope and 4 Tube type: Parker flex tip Tube size: 7.5 mm Number of attempts: 1 Airway Equipment and Method: Stylet and Oral airway Placement Confirmation: ETT inserted through vocal cords under direct vision,  positive ETCO2 and breath sounds checked- equal and bilateral Secured at: 22 cm Tube secured with: Tape Dental Injury: Teeth and Oropharynx as per pre-operative assessment

## 2016-07-27 NOTE — Anesthesia Postprocedure Evaluation (Addendum)
Anesthesia Post Note  Patient: Jeremy Fisher  Procedure(s) Performed: Procedure(s) (LRB): MINIMALLY INVASIVE MITRAL VALVE (MV) REPAIR (Right) TRANSESOPHAGEAL ECHOCARDIOGRAM (TEE) (N/A) REPAIR OF PATENT FORAMEN OVALE (N/A)  Patient location during evaluation: SICU Anesthesia Type: General Level of consciousness: patient remains intubated per anesthesia plan Pain management: pain level controlled Vital Signs Assessment: post-procedure vital signs reviewed and stable Respiratory status: patient remains intubated per anesthesia plan and patient on ventilator - see flowsheet for VS Cardiovascular status: blood pressure returned to baseline Anesthetic complications: no       Last Vitals:  Vitals:   07/27/16 1830 07/27/16 1845  BP: 118/67   Pulse: 80 80  Resp: 16 10  Temp: 36.4 C 36.5 C    Last Pain:  Vitals:   07/27/16 0552  TempSrc: Oral                 Fritzi Scripter COKER

## 2016-07-27 NOTE — Progress Notes (Signed)
Weaning parameters started at 2043. Patient extubated at 2140 to 4L nasal cannula. Patient performed NIF -20 and VC of 2.0L. Patient tolerated well and was able to vocalize after extubation. RT will continue to monitor.

## 2016-07-27 NOTE — Progress Notes (Signed)
[facility] BRIEF SICU PROGRESS NOTE  Day of Surgery  S/P Procedure(s) (LRB): MINIMALLY INVASIVE MITRAL VALVE (MV) REPAIR (Right) TRANSESOPHAGEAL ECHOCARDIOGRAM (TEE) (N/A) REPAIR OF PATENT FORAMEN OVALE (N/A)   Starting to wake on vent NSR w/ occasional runs of bigeminy Stable hemodynamics, no drips O2 sats 100% Minimal chest tube output UOP adequate Labs okay  Plan: Continue routine early postop  Purcell Nails, MD 07/27/2016 5:55 PM

## 2016-07-27 NOTE — Progress Notes (Addendum)
  Echocardiogram Echocardiogram Transesophageal has been performed.  Jeremy Fisher L Androw 07/27/2016, 9:40 AM

## 2016-07-27 NOTE — Transfer of Care (Signed)
Immediate Anesthesia Transfer of Care Note  Patient: Jeremy Fisher  Procedure(s) Performed: Procedure(s): MINIMALLY INVASIVE MITRAL VALVE (MV) REPAIR (Right) TRANSESOPHAGEAL ECHOCARDIOGRAM (TEE) (N/A) REPAIR OF PATENT FORAMEN OVALE (N/A)  Patient Location: SICU  Anesthesia Type:General  Level of Consciousness: sedated and Patient remains intubated per anesthesia plan  Airway & Oxygen Therapy: Patient remains intubated per anesthesia plan and Patient placed on Ventilator (see vital sign flow sheet for setting)  Post-op Assessment: Report given to RN and Post -op Vital signs reviewed and stable  Post vital signs: Reviewed and stable  Last Vitals:  Vitals:   07/27/16 0552 07/27/16 1445  BP: (!) 149/74 115/67  Pulse: 64 90  Resp: 16 14  Temp: 36.1 C     Last Pain:  Vitals:   07/27/16 0552  TempSrc: Oral      Patients Stated Pain Goal: 2 (07/27/16 0558)  Complications: No apparent anesthesia complications

## 2016-07-27 NOTE — Op Note (Signed)
CARDIOTHORACIC SURGERY OPERATIVE NOTE  Date of Procedure:  07/27/2016  Preoperative Diagnosis:   Severe Mitral Regurgitation  Patent Foramen Ovale  Postoperative Diagnosis: Same  Procedure:    Minimally-Invasive Mitral Valve Repair  Complex valvuloplasty including quadrangular resection of posterior leaflet (P2)  Artificial Gore-tex neochord placement x 10  Sorin Memo 3D Ring Annuloplasty (size 38mm, catalog # [id number], serial # [id number])   Closure of Patent Foramen Ovale   Surgeon: Salvatore Decent. 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  Flail segment of posterior leaflet (P2) with multiple ruptured chordae tendineae  Type II mitral valve dysfunction with severe mitral regurgitation  Mild LV chamber enlargement with normal LV systolic function  Patent foramen ovale  No residual mitral regurgitation after successful valve repair                      BRIEF CLINICAL NOTE AND INDICATIONS FOR SURGERY  Patient is a 76 year old white male retired Development worker, international aid from [place] with no previous cardiac history who has been referred for surgical consultation to discuss treatment options for management of mitral valve prolapse with severe primary mitral regurgitation. He suffered a pulmonary embolism approximately 10 years ago and was treated with warfarin anticoagulation for approximately 5 years. He underwent an extensive hematologic workup that was essentially unrevealing. In 2015 he presented with gallstone pancreatitis. He underwent laparoscopic cholecystectomy and later underwent ERCP and recovered uneventfully. He has otherwise been remarkably healthy all of his adult life. He denies any history of hypertension, hyperlipidemia, diabetes mellitus, palpitations, or previous history of heart murmur. He was recently evaluated at  the [facility] to establish primary care because he is a service connected veteran havingserved in [place]. He was noted to have a prominent murmur on physical exam and a transthoracic echocardiogram was performed revealing the presence of mitral valve prolapse with a large flail segment of the posterior leaflet of the mitral valve and severe mitral regurgitation. The patient has been referred for elective surgical consultation.  The patient has been seen in consultation and counseled at length regarding the indications, risks and potential benefits of surgery.  All questions have been answered, and the patient provides full informed consent for the operation as described.    DETAILS OF THE OPERATIVE PROCEDURE  Preparation:  The patient is brought to the operating room on the above mentioned date and central monitoring was established by the anesthesia team including placement of Swan-Ganz catheter through the left internal jugular vein.  A radial arterial line is placed. The patient is placed in the supine position on the operating table.  Intravenous antibiotics are administered. General endotracheal anesthesia is induced uneventfully. The patient is initially intubated using a dual lumen endotracheal tube.  A Foley catheter is placed.  Baseline transesophageal echocardiogram was performed.  Findings were notable for classical forme fruste variant of Barlow's type myxomatous degenerative disease of the mitral valve.  There was Bileaflet billowing and prolapse with a large flail segment involving a portion of the middle scallop (P2) of the posterior leaflet with multiple ruptured chordae tendinea. There was severe mitral regurgitation. There was mild left ventricular chamber enlargement with normal left ventricular systolic function are there was mild left atrial enlargement. There was a patent foramen ovale. Aortic valve appeared normal. Right ventricular size and function was normal.  There was trace to mild tricuspid regurgitation. The  tricuspid annulus was not dilated.  A soft roll is placed behind the patient's left scapula and the neck gently extended and turned to the left.   The patient's right neck, chest, abdomen, both groins, and both lower extremities are prepared and draped in a sterile manner. A time out procedure is performed.  Surgical Approach:  A right miniature anterolateral thoracotomy incision is performed. The incision is placed just lateral to and superior to the right nipple. The pectoralis major muscle is retracted medially and completely preserved. The right pleural space is entered through the 3rd intercostal space. A soft tissue retractor is placed.  Two 11 mm ports are placed through separate stab incisions inferiorly. The right pleural space is insufflated continuously with carbon dioxide gas through the posterior port during the remainder of the operation.  A pledgeted sutures placed through the dome of the right hemidiaphragm and retracted inferiorly to facilitate exposure.  A longitudinal incision is made in the pericardium 3 cm anterior to the phrenic nerve and silk traction sutures are placed on either side of the incision for exposure.   Extracorporeal Cardiopulmonary Bypass and Myocardial Protection:  A small incision is made in the right inguinal crease and the anterior surface of the right common femoral artery and right common femoral vein are identified.  The patient is placed in Trendelenburg position. The right internal jugular vein is cannulated with Seldinger technique and a guidewire advanced into the right atrium. The patient is heparinized systemically. The right internal jugular vein is cannulated with a 14 [place] pediatric femoral venous cannula. Pursestring sutures are placed on the anterior surface of the right common femoral vein and right common femoral artery. The right common femoral vein is cannulated with the Seldinger technique  and a guidewire is advanced under transesophageal echocardiogram guidance through the right atrium. The femoral vein is cannulated with a long 22 French femoral venous cannula. The right common femoral artery is cannulated with Seldinger technique and a flexible guidewire is advanced until it can be appreciated intraluminally in the descending thoracic aorta on transesophageal echocardiogram. The femoral artery is cannulated with an 18 French femoral arterial cannula.  Adequate heparinization is verified.     The entire pre-bypass portion of the operation was notable for stable hemodynamics.  Cardiopulmonary bypass was begun.  Vacuum assist venous drainage is utilized. The incision in the pericardium is extended in both directions. Venous drainage and exposure are notably excellent. A retrograde cardioplegia cannula is placed through the right atrium into the coronary sinus using transesophageal echocardiogram guidance.  An antegrade cardioplegia cannula is placed in the ascending aorta.    The patient is cooled to 28C systemic temperature.  The aortic cross clamp is applied and cardioplegia is delivered initially in an antegrade fashion through the aortic root using modified [place] cold blood cardioplegia (KBC protocol).   The initial cardioplegic arrest is rapid with early diastolic arrest.  Repeat doses of cardioplegia are administered at 90 minutes and every 30 minutes thereafter through the aortic root and through the coronary sinus catheter in order to maintain completely flat electrocardiogram.  Myocardial protection was felt to be excellent.   Closure of Atrial Septal Defect:  A left atriotomy incision was performed through the interatrial groove and extended partially across the back wall of the left atrium after opening the oblique sinus inferiorly.   The intra-atrial septum is examined and there is an obvious patent foramen ovale. This is closed using simple running 4-0 Prolene  suture.  Mitral Valve Repair:  The mitral valve is exposed using a self-retaining retractor.  The mitral valve was inspected and notable for classical forme fruste variant of Barlow's type myxomatous degenerative disease of the mitral valve.  Mitral valve was relatively large with billowing and Bileaflet prolapse. There was mild thickening of both leaflets with excessive leaflet mobility. There was minimal calcification. There was a flail segment involving a portion of the middle scallop (P2) of the posterior leaflet with billowing and prolapse of all segments of the posterior leaflet.  There was excessive tissue but the posterior leaflet was not particularly tall in height.  Interrupted 2-0 Ethibond horizontal mattress sutures are placed circumferentially around the entire mitral valve annulus. The sutures will ultimately be utilized for ring annuloplasty, and at this juncture there are utilized to suspend the valve symmetrically.  The flail segment of the posterior leaflet was repaired using a quadrangular resection. The total amount of the surface area of P2 resected was less than 25%. The quadrangular resection was tapered towards the posterior annulus such that sliding plasty was not necessary. The intervening vertical defect in the posterior leaflet was closed using simple everting interrupted CV 5 Gore-Tex suture.  The remainder of the posterior leaflet was supported using artificial neochord placement was performed with Chord-X multi-strand CV-4 Goretex pre-measured loops.  The appropriate cord length was measured from corresponding normal length primary cords from the A1 and A3 segment of the anterior leaflet. The papillary muscle suture of the first Chord-X multi-strand suture was placed through the head of the anterior papillary muscle in a horizontal mattress fashion and tied over Teflon felt pledgets. Each of the three pre-measured loops were then reimplanted into the free margin of the P1  and P2 segments of the posterior leaflet on the anterior side of midline.  The papillary muscle suture of a second Chord-X multi-strand suture was placed through the head of the posterior papillary muscle in a horizontal mattress fashion and tied over Teflon felt pledgets. Two of the three pre-measured loops were then reimplanted into the free margin of the P2 and P3 segments of the posterior leaflet.  The third pre-measured loop was discarded such that a total of 5 loops (10 neochords) were implanted.  The valve was tested with saline and appeared competent even without ring annuloplasty complete. The valve was sized to a 38 mm annuloplasty ring, based upon the transverse distance between the left and right commissures and the height of the anterior leaflet, corresponding to a size just slightly larger than the overall surface area of the anterior leaflet.  A Sorin Memo 3D annuloplasty ring (size 57mm, catalog [id number], serial [id number]) was secured in place uneventfully. All ring sutures were secured using a Cor-knot device.    The valve is again tested with saline and appears to be perfectly competent with a broad symmetrical line of coaptation of the anterior and posterior leaflet. There is no residual leak. There was a broad, symmetrical line of coaptation of the anterior and posterior leaflet which was confirmed using the blue ink test.  Rewarming is begun.   Procedure Completion:  The atriotomy was closed using a 2-layer closure of running 3-0 Prolene suture after placing a sump drain across the mitral valve to serve as a left ventricular vent.  One final dose of warm "reanimation" cardioplegia was administered antegrade through the aortic root.  The aortic cross clamp was removed after a total cross clamp time of 147 minutes.  Epicardial pacing wires are fixed  to the inferior wall of the right ventricule and to the right atrial appendage. The patient is rewarmed to 37C temperature. The left  ventricular vent is removed.  The patient is ventilated and flow volumes turndown while the mitral valve repair is inspected using transesophageal echocardiogram. The valve repair appears intact with no residual leak. The antegrade cardioplegia cannula is now removed. The patient is weaned and disconnected from cardiopulmonary bypass.  The patient's rhythm at separation from bypass was sinus.  The patient was weaned from bypass without any inotropic support. Total cardiopulmonary bypass time for the operation was 192 minutes.  Followup transesophageal echocardiogram performed after separation from bypass revealed a well-seated annuloplasty ring in the mitral position with a normal functioning mitral valve. There was no residual leak.  Left ventricular function was unchanged from preoperatively.  The mean gradient across the mitral valve was estimated to be 2 mmHg.  The femoral arterial and venous cannulae were removed uneventfully. There was a palpable pulse in the distal right common femoral artery after removal of the cannula. Protamine was administered to reverse the anticoagulation. The right internal jugular cannula was removed and manual pressure held on the neck for 15 minutes.  Single lung ventilation was begun. The atriotomy closure was inspected for hemostasis. The pericardial sac was drained using a 28 French Bard drain placed through the anterior port incision.  The pericardium was closed using a patch of core matrix bovine submucosal tissue patch. The right pleural space is irrigated with saline solution and inspected for hemostasis. The right pleural space was drained using a 28 French Bard drain placed through the posterior port incision. An On-Q catheter was placed through the posterior port incision and tunneled into the subpleural space posteriorly to cover the 2nd through the 6th intercostal nerve bundles posteriorly.  The catheter was flushed with 0.5% bupivacaine solution and ultimately  connected to a continuous infusion pump. The miniature thoracotomy incision was closed in multiple layers in routine fashion. The right groin incision was inspected for hemostasis and closed in multiple layers in routine fashion.  The post-bypass portion of the operation was notable for stable rhythm and hemodynamics.  No blood products were administered during the operation.   Disposition:  The patient tolerated the procedure well.  The patient was reintubated using a single lumen endotracheal tube and subsequently transported to the surgical intensive care unit in stable condition. There were no intraoperative complications. All sponge instrument and needle counts are verified correct at completion of the operation.     Salvatore Decent. [name] MD 07/27/2016 2:17 PM

## 2016-07-27 NOTE — Progress Notes (Signed)
07/27/2016 1700 Dr. [name] paged and made aware of pt. With intermittent ventricular bigeminy. Recent labs and vital signs reviewed. No new orders received at this time. Will continue to closely monitor patient.  Mileah Hemmer, Blanchard Kelch

## 2016-07-27 NOTE — Brief Op Note (Addendum)
07/27/2016  12:53 PM  PATIENT:  Jeremy Fisher  76 y.o. 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

## 2016-07-28 ENCOUNTER — Encounter (HOSPITAL_COMMUNITY): Payer: Self-pay | Admitting: Thoracic Surgery (Cardiothoracic Vascular Surgery)

## 2016-07-28 ENCOUNTER — Inpatient Hospital Stay (HOSPITAL_COMMUNITY): Payer: Medicare Other

## 2016-07-28 LAB — POCT I-STAT 3, ART BLOOD GAS (G3+)
ACID-BASE DEFICIT: 4 mmol/L — AB (ref 0.0–2.0)
Bicarbonate: 20.9 mmol/L (ref 20.0–28.0)
O2 Saturation: 99 %
PH ART: 7.385 (ref 7.350–7.450)
TCO2: 22 mmol/L (ref 0–100)
pCO2 arterial: 34.8 mmHg (ref 32.0–48.0)
pO2, Arterial: 162 mmHg — ABNORMAL HIGH (ref 83.0–108.0)

## 2016-07-28 LAB — CBC
HCT: 33.6 % — ABNORMAL LOW (ref 39.0–52.0)
HEMATOCRIT: 33 % — AB (ref 39.0–52.0)
HEMOGLOBIN: 11.6 g/dL — AB (ref 13.0–17.0)
HEMOGLOBIN: 11.5 g/dL — AB (ref 13.0–17.0)
MCH: 29.8 pg (ref 26.0–34.0)
MCH: 29.2 pg (ref 26.0–34.0)
MCHC: 35.2 g/dL (ref 30.0–36.0)
MCHC: 34.2 g/dL (ref 30.0–36.0)
MCV: 84.8 fL (ref 78.0–100.0)
MCV: 85.3 fL (ref 78.0–100.0)
PLATELETS: 102 10*3/uL — AB (ref 150–400)
PLATELETS: 111 10*3/uL — AB (ref 150–400)
RBC: 3.89 MIL/uL — AB (ref 4.22–5.81)
RBC: 3.94 MIL/uL — AB (ref 4.22–5.81)
RDW: 13.6 % (ref 11.5–15.5)
RDW: 13.5 % (ref 11.5–15.5)
WBC: 7.4 10*3/uL (ref 4.0–10.5)
WBC: 10.3 10*3/uL (ref 4.0–10.5)

## 2016-07-28 LAB — POCT I-STAT, CHEM 8
BUN: 11 mg/dL (ref 6–20)
CALCIUM ION: 1.21 mmol/L (ref 1.15–1.40)
CHLORIDE: 100 mmol/L — AB (ref 101–111)
Creatinine, Ser: 1 mg/dL (ref 0.61–1.24)
Glucose, Bld: 107 mg/dL — ABNORMAL HIGH (ref 65–99)
HEMATOCRIT: 31 % — AB (ref 39.0–52.0)
Hemoglobin: 10.5 g/dL — ABNORMAL LOW (ref 13.0–17.0)
Potassium: 3.6 mmol/L (ref 3.5–5.1)
SODIUM: 138 mmol/L (ref 135–145)
TCO2: 28 mmol/L (ref 0–100)

## 2016-07-28 LAB — GLUCOSE, CAPILLARY
GLUCOSE-CAPILLARY: 114 mg/dL — AB (ref 65–99)
GLUCOSE-CAPILLARY: 115 mg/dL — AB (ref 65–99)
GLUCOSE-CAPILLARY: 108 mg/dL — AB (ref 65–99)
GLUCOSE-CAPILLARY: 136 mg/dL — AB (ref 65–99)
Glucose-Capillary: 122 mg/dL — ABNORMAL HIGH (ref 65–99)
Glucose-Capillary: 125 mg/dL — ABNORMAL HIGH (ref 65–99)
Glucose-Capillary: 139 mg/dL — ABNORMAL HIGH (ref 65–99)

## 2016-07-28 LAB — BASIC METABOLIC PANEL
ANION GAP: 5 (ref 5–15)
BUN: 10 mg/dL (ref 6–20)
CHLORIDE: 107 mmol/L (ref 101–111)
CO2: 22 mmol/L (ref 22–32)
Calcium: 8 mg/dL — ABNORMAL LOW (ref 8.9–10.3)
Creatinine, Ser: 0.87 mg/dL (ref 0.61–1.24)
GFR calc Af Amer: >60 mL/min (ref >60)
GFR calc non Af Amer: >60 mL/min (ref >60)
GLUCOSE: 113 mg/dL — AB (ref 65–99)
POTASSIUM: 4.5 mmol/L (ref 3.5–5.1)
Sodium: 134 mmol/L — ABNORMAL LOW (ref 135–145)

## 2016-07-28 LAB — MAGNESIUM
MAGNESIUM: 2.1 mg/dL (ref 1.7–2.4)
Magnesium: 2.3 mg/dL (ref 1.7–2.4)

## 2016-07-28 LAB — CREATININE, SERUM
CREATININE: 0.98 mg/dL (ref 0.61–1.24)
GFR calc Af Amer: >60 mL/min (ref >60)

## 2016-07-28 MED ORDER — SODIUM CHLORIDE 0.9 % IV SOLN
30.0000 meq | Freq: Once | INTRAVENOUS | Status: AC
  Filled 2016-07-28: qty 15

## 2016-07-28 MED ORDER — ORAL CARE MOUTH RINSE: 15.0000 mL | Freq: Two times a day (BID) | OROMUCOSAL | Status: DC

## 2016-07-28 MED ORDER — CHLORHEXIDINE GLUCONATE CLOTH 2 % EX PADS: 6.0000 | MEDICATED_PAD | Freq: Every day | CUTANEOUS | Status: DC

## 2016-07-28 MED ORDER — WARFARIN SODIUM 2.5 MG PO TABS
2.5000 mg | ORAL_TABLET | Freq: Every day | ORAL | Status: DC
  Administered 2016-07-28: 2.5 mg via ORAL
  Filled 2016-07-28: qty 1

## 2016-07-28 MED ORDER — SODIUM CHLORIDE 0.9% FLUSH
10.0000 mL | Freq: Two times a day (BID) | INTRAVENOUS | Status: DC
  Administered 2016-07-29 – 2016-07-30 (×2): 10 mL

## 2016-07-28 MED ORDER — FUROSEMIDE 10 MG/ML IJ SOLN
20.0000 mg | Freq: Four times a day (QID) | INTRAMUSCULAR | Status: AC
  Administered 2016-07-28 (×3): 20 mg via INTRAVENOUS
  Filled 2016-07-28 (×3): qty 2

## 2016-07-28 MED ORDER — WARFARIN - PHYSICIAN DOSING INPATIENT: Freq: Every day | Status: DC

## 2016-07-28 MED ORDER — ENOXAPARIN SODIUM 40 MG/0.4ML ~~LOC~~ SOLN
40.0000 mg | SUBCUTANEOUS | Status: DC
  Filled 2016-07-28: qty 0.4

## 2016-07-28 MED ORDER — SODIUM CHLORIDE 0.9% FLUSH
10.0000 mL | INTRAVENOUS | Status: DC | PRN
  Administered 2016-07-30: 10 mL
  Filled 2016-07-28: qty 40

## 2016-07-28 MED ORDER — INSULIN ASPART 100 UNIT/ML ~~LOC~~ SOLN
0.0000 [IU] | SUBCUTANEOUS | Status: DC
  Administered 2016-07-28 (×2): 2 [IU] via SUBCUTANEOUS

## 2016-07-28 MED FILL — Sodium Chloride IV Soln 0.9%: INTRAVENOUS | Qty: 2000 | Status: AC

## 2016-07-28 MED FILL — Heparin Sodium (Porcine) Inj 1000 Unit/ML: INTRAMUSCULAR | Qty: 2500 | Status: AC

## 2016-07-28 MED FILL — Heparin Sodium (Porcine) Inj 1000 Unit/ML: INTRAMUSCULAR | Qty: 30 | Status: AC

## 2016-07-28 MED FILL — Lidocaine HCl IV Inj 20 MG/ML: INTRAVENOUS | Qty: 30 | Status: AC

## 2016-07-28 MED FILL — Mannitol IV Soln 20%: INTRAVENOUS | Qty: 1000 | Status: AC

## 2016-07-28 MED FILL — Dexmedetomidine HCl in NaCl 0.9% IV Soln 400 MCG/100ML: INTRAVENOUS | Qty: 100 | Status: AC

## 2016-07-28 MED FILL — Electrolyte-R (PH 7.4) Solution: INTRAVENOUS | Qty: 4000 | Status: AC

## 2016-07-28 MED FILL — Sodium Bicarbonate IV Soln 8.4%: INTRAVENOUS | Qty: 50 | Status: AC

## 2016-07-28 NOTE — Care Management Note (Signed)
Case Management Note  Patient Details  Name: Jeremy Fisher MRN: [id number] Date of Birth: 1940-04-08  Subjective/Objective:    POD 1  MVR repair, TEE, and repair of PFO , conts on 4 liters, with post op thrombocytopenia, for gentle diuresis, will keep chest tubes for 2-3 days until output decreases.              Action/Plan: NCM will follow patient progression.  Expected Discharge Date:                  Expected Discharge Plan:     In-House Referral:     Discharge planning Services  CM Consult  Post Acute Care Choice:    Choice offered to:     DME Arranged:    DME Agency:     HH Arranged:    HH Agency:     Status of Service:  In process, will continue to follow  If discussed at Long Length of Stay Meetings, dates discussed:    Additional Comments:  Leone Haven, RN 07/28/2016, 3:31 PM

## 2016-07-28 NOTE — Progress Notes (Signed)
Anesthesiology Follow-up:  Awake and alert in good spirits. Minimal incisional pain.  VS: T- 37.4 BP-113/60 HR-71 (SR 1st degree AVB) RR- 16 O2 sat 98%  PA 29/11 CO/CI 5.9/3.0  K-4.5 glucose- 113 BUN/Cr. 10/0.87 H/H- 11.6/33 Platelets- 102,000  Extubated at 21:46 last night, 7 hours post-op  Doing well stable post-op course so far.  Kipp Brood

## 2016-07-28 NOTE — Progress Notes (Signed)
CT surgery p.m. Rounds  Patient examined and record reviewed.Hemodynamics stable,labs satisfactory.Patient had stable day.Continue current care. Peter Van Trigt III 07/28/2016   

## 2016-07-28 NOTE — Progress Notes (Signed)
      [address]       [place] [address]             [phone]        CARDIOTHORACIC SURGERY PROGRESS NOTE   R1 Day Post-Op Procedure(s) (LRB): MINIMALLY INVASIVE MITRAL VALVE (MV) REPAIR (Right) TRANSESOPHAGEAL ECHOCARDIOGRAM (TEE) (N/A) REPAIR OF PATENT FORAMEN OVALE (N/A)  Subjective: Looks good.  No pain or SOB.  Feels "great"  Objective: Vital signs: BP Readings from Last 1 Encounters:  07/28/16 (!) 108/57   Pulse Readings from Last 1 Encounters:  07/28/16 68   Resp Readings from Last 1 Encounters:  07/28/16 16   Temp Readings from Last 1 Encounters:  07/28/16 99.3 F (37.4 C)    Hemodynamics: PAP: (20-38)/(8-24) 33/15 CO:  [5.4 L/min-6.7 L/min] 5.9 L/min CI:  [2.8 L/min/m2-3.4 L/min/m2] 3 L/min/m2  Physical Exam:  Rhythm:   sinus  Breath sounds: clear  Heart sounds:  RRR w/out murmur  Incisions:  Dressings dry, intact  Abdomen:  Soft, non-distended, non-tender  Extremities:  Warm, well-perfused  Chest tubes:  Relatively low volume thin serosanguinous output, no air leak    Intake/Output from previous day: 04/25 0701 - 04/26 0700 In: 4499.4 [P.O.:360; I.V.:3259.4; Blood:200; NG/GT:30; IV Piggyback:650] Out: 3394 [Urine:2455; Emesis/NG output:30; Blood:408; Chest Tube:501] Intake/Output this shift: No intake/output data recorded.  Lab Results:  CBC: Recent Labs  07/27/16 2006 07/27/16 2013 07/28/16 0435  WBC 9.3  --  7.4  HGB 11.9* 10.5* 11.6*  HCT 34.4* 31.0* 33.0*  PLT 123*  --  102*    BMET:  Recent Labs  07/25/16 1135  07/27/16 2013 07/28/16 0435  NA 137  < > 139 134*  K 4.0  < > 4.4 4.5  CL 107  < > 107 107  CO2 20*  --   --  22  GLUCOSE 102*  < > 137* 113*  BUN 14  < > 12 10  CREATININE 0.94  < > 0.90 0.87  CALCIUM 9.7  --   --  8.0*  < > = values in this interval not displayed.   PT/INR:   Recent Labs  07/27/16 1445  LABPROT 16.4*  INR 1.32    CBG (last 3)   Recent Labs  07/27/16 2011  07/28/16 0054 07/28/16 0439  GLUCAP 125* 122* 114*    ABG    Component Value Date/Time   PHART 7.385 07/27/2016 2129   PCO2ART 34.8 07/27/2016 2129   PO2ART 162.0 (H) 07/27/2016 2129   HCO3 20.9 07/27/2016 2129   TCO2 22 07/27/2016 2129   ACIDBASEDEF 4.0 (H) 07/27/2016 2129   O2SAT 99.0 07/27/2016 2129    CXR: Looks clear.  Minimal atelectasis  EKG: NSR w/out acute ischemic changes, 1st degree AV block   Assessment/Plan: S/P Procedure(s) (LRB): MINIMALLY INVASIVE MITRAL VALVE (MV) REPAIR (Right) TRANSESOPHAGEAL ECHOCARDIOGRAM (TEE) (N/A) REPAIR OF PATENT FORAMEN OVALE (N/A)  Doing well POD1 Maintaining NSR w/ stable hemodynamics, no drips Breathing comfortably w/ O2 sats 99% on 4 L/min Expected post op acute blood loss anemia, mild Expected post op atelectasis, very mild Expected post op volume excess, mild, weight reportedly 5 lbs > baseline Post op thrombocytopenia, mild   Mobilize  D/C lines  Gentle diuresis  Keep chest tubes at least 2-3 days until output decreases  Start coumadin slowly  Watch platelet count   Purcell Nails, MD 07/28/2016 7:53 AM

## 2016-07-28 NOTE — Addendum Note (Signed)
Addendum  created 07/28/16 1043 by Kipp Brood, MD   Sign clinical note

## 2016-07-29 ENCOUNTER — Inpatient Hospital Stay (HOSPITAL_COMMUNITY): Payer: Medicare Other

## 2016-07-29 LAB — CBC
HCT: 33.3 % — ABNORMAL LOW (ref 39.0–52.0)
HEMOGLOBIN: 11.5 g/dL — AB (ref 13.0–17.0)
MCH: 29.3 pg (ref 26.0–34.0)
MCHC: 34.5 g/dL (ref 30.0–36.0)
MCV: 84.9 fL (ref 78.0–100.0)
PLATELETS: 99 10*3/uL — AB (ref 150–400)
RBC: 3.92 MIL/uL — ABNORMAL LOW (ref 4.22–5.81)
RDW: 13.5 % (ref 11.5–15.5)
WBC: 10 10*3/uL (ref 4.0–10.5)

## 2016-07-29 LAB — GLUCOSE, CAPILLARY
GLUCOSE-CAPILLARY: 106 mg/dL — AB (ref 65–99)
GLUCOSE-CAPILLARY: 100 mg/dL — AB (ref 65–99)
Glucose-Capillary: 102 mg/dL — ABNORMAL HIGH (ref 65–99)
Glucose-Capillary: 96 mg/dL (ref 65–99)

## 2016-07-29 LAB — BASIC METABOLIC PANEL
ANION GAP: 4 — AB (ref 5–15)
BUN: 13 mg/dL (ref 6–20)
CALCIUM: 8.8 mg/dL — AB (ref 8.9–10.3)
CO2: 27 mmol/L (ref 22–32)
CREATININE: 0.96 mg/dL (ref 0.61–1.24)
Chloride: 104 mmol/L (ref 101–111)
Glucose, Bld: 108 mg/dL — ABNORMAL HIGH (ref 65–99)
Potassium: 3.6 mmol/L (ref 3.5–5.1)
SODIUM: 135 mmol/L (ref 135–145)

## 2016-07-29 LAB — PROTIME-INR
INR: 1.23
Prothrombin Time: 15.6 seconds — ABNORMAL HIGH (ref 11.4–15.2)

## 2016-07-29 MED ORDER — ASPIRIN EC 81 MG PO TBEC
81.0000 mg | DELAYED_RELEASE_TABLET | Freq: Every day | ORAL | Status: DC
  Administered 2016-07-30 – 2016-07-31 (×2): 81 mg via ORAL
  Filled 2016-07-29 (×2): qty 1

## 2016-07-29 MED ORDER — MOVING RIGHT ALONG BOOK
Freq: Once | Status: DC
  Filled 2016-07-29: qty 1

## 2016-07-29 MED ORDER — SODIUM CHLORIDE 0.9 % IV SOLN
30.0000 meq | Freq: Once | INTRAVENOUS | Status: AC
  Administered 2016-07-29: 30 meq via INTRAVENOUS
  Filled 2016-07-29: qty 15

## 2016-07-29 MED ORDER — AMIODARONE HCL 200 MG PO TABS: 200.0000 mg | ORAL_TABLET | Freq: Every day | ORAL | Status: DC

## 2016-07-29 MED ORDER — SODIUM CHLORIDE 0.9 % IV SOLN: 250.0000 mL | INTRAVENOUS | Status: DC | PRN

## 2016-07-29 MED ORDER — FUROSEMIDE 40 MG PO TABS
40.0000 mg | ORAL_TABLET | Freq: Every day | ORAL | Status: AC
  Administered 2016-07-29 – 2016-07-30 (×2): 40 mg via ORAL
  Filled 2016-07-29 (×2): qty 1

## 2016-07-29 MED ORDER — POTASSIUM CHLORIDE CRYS ER 20 MEQ PO TBCR: 40.0000 meq | EXTENDED_RELEASE_TABLET | Freq: Every day | ORAL | Status: DC

## 2016-07-29 MED ORDER — SODIUM CHLORIDE 0.9% FLUSH: 3.0000 mL | INTRAVENOUS | Status: DC | PRN

## 2016-07-29 MED ORDER — ASPIRIN EC 325 MG PO TBEC
325.0000 mg | DELAYED_RELEASE_TABLET | Freq: Every day | ORAL | Status: AC
  Administered 2016-07-29: 325 mg via ORAL
  Filled 2016-07-29: qty 1

## 2016-07-29 MED ORDER — SODIUM CHLORIDE 0.9% FLUSH
3.0000 mL | Freq: Two times a day (BID) | INTRAVENOUS | Status: DC
  Administered 2016-07-29 – 2016-07-31 (×3): 3 mL via INTRAVENOUS

## 2016-07-29 MED ORDER — POTASSIUM CHLORIDE CRYS ER 20 MEQ PO TBCR
40.0000 meq | EXTENDED_RELEASE_TABLET | Freq: Every day | ORAL | Status: DC
  Administered 2016-07-30: 40 meq via ORAL
  Filled 2016-07-29 (×2): qty 2

## 2016-07-29 MED ORDER — WARFARIN SODIUM 5 MG PO TABS
5.0000 mg | ORAL_TABLET | Freq: Every day | ORAL | Status: DC
  Administered 2016-07-29 – 2016-07-30 (×2): 5 mg via ORAL
  Filled 2016-07-29 (×2): qty 1

## 2016-07-29 MED ORDER — AMIODARONE HCL 200 MG PO TABS
200.0000 mg | ORAL_TABLET | Freq: Every day | ORAL | Status: DC
  Administered 2016-07-29: 200 mg via ORAL
  Filled 2016-07-29: qty 1

## 2016-07-29 NOTE — Progress Notes (Signed)
[address]       [place] [address]             [phone]        CARDIOTHORACIC SURGERY PROGRESS NOTE   R2 Days Post-Op Procedure(s) (LRB): MINIMALLY INVASIVE MITRAL VALVE (MV) REPAIR (Right) TRANSESOPHAGEAL ECHOCARDIOGRAM (TEE) (N/A) REPAIR OF PATENT FORAMEN OVALE (N/A)  Subjective: Feels well.  No complaints.  Some mild pain early this morning, relieved after coughing  Objective: Vital signs: BP Readings from Last 1 Encounters:  07/29/16 (!) 103/56   Pulse Readings from Last 1 Encounters:  07/29/16 61   Resp Readings from Last 1 Encounters:  07/29/16 18   Temp Readings from Last 1 Encounters:  07/29/16 98.9 F (37.2 C) (Oral)    Hemodynamics:    Physical Exam:  Rhythm:   sinus  Breath sounds: clear  Heart sounds:  RRR w/out murmur  Incisions:  Dressings dry, intact  Abdomen:  Soft, non-distended, non-tender  Extremities:  Warm, well-perfused  Chest tubes:  decreasing volume thin serosanguinous output, no air leak    Intake/Output from previous day: 04/26 0701 - 04/27 0700 In: 2120 [P.O.:960; I.V.:480; IV Piggyback:680] Out: 4485 [Urine:3985; Chest Tube:500] Intake/Output this shift: Total I/O In: 0  Out: 150 [Urine:150]  Lab Results:  CBC: Recent Labs  07/28/16 1731 07/28/16 1736 07/29/16 0312  WBC 10.3  --  10.0  HGB 11.5* 10.5* 11.5*  HCT 33.6* 31.0* 33.3*  PLT 111*  --  99*    BMET:  Recent Labs  07/28/16 0435  07/28/16 1736 07/29/16 0312  NA 134*  --  138 135  K 4.5  --  3.6 3.6  CL 107  --  100* 104  CO2 22  --   --  27  GLUCOSE 113*  --  107* 108*  BUN 10  --  11 13  CREATININE 0.87  < > 1.00 0.96  CALCIUM 8.0*  --   --  8.8*  < > = values in this interval not displayed.   PT/INR:   Recent Labs  07/29/16 0312  LABPROT 15.6*  INR 1.23    CBG (last 3)   Recent Labs  07/29/16 0011 07/29/16 0411 07/29/16 0844  GLUCAP 102* 106* 100*    ABG    Component Value Date/Time   PHART 7.385  07/27/2016 2129   PCO2ART 34.8 07/27/2016 2129   PO2ART 162.0 (H) 07/27/2016 2129   HCO3 20.9 07/27/2016 2129   TCO2 28 07/28/2016 1736   ACIDBASEDEF 4.0 (H) 07/27/2016 2129   O2SAT 99.0 07/27/2016 2129    CXR: PORTABLE CHEST 1 VIEW  COMPARISON:  July 28, 2016  FINDINGS: Chest tube noted on the right. Central catheter tips in left innominate vein with removal of Swan-Ganz catheter. Temporary pacemaker leads are attached to the right heart. No pneumothorax. There is atelectatic change in the left base. Lungs elsewhere are clear. Heart is borderline prominent with pulmonary vascularity within normal limits. Patient is status post mitral valve replacement. No adenopathy. No bone lesions.  IMPRESSION: Tube and catheter positions as described without evident pneumothorax. Left base atelectasis. Lungs elsewhere clear. Stable cardiac prominence.   Electronically Signed   By: Bretta Bang III M.D.   On: 07/29/2016 08:09   Assessment/Plan: S/P Procedure(s) (LRB): MINIMALLY INVASIVE MITRAL VALVE (MV) REPAIR (Right) TRANSESOPHAGEAL ECHOCARDIOGRAM (TEE) (N/A) REPAIR OF PATENT FORAMEN OVALE (N/A)  Doing well POD2 Maintaining NSR w/ stable BP Breathing comfortably w/ O2 sats 99% on  room air Expected post op acute blood loss anemia, mild Expected post op atelectasis, very mild Expected post op volume excess, mild, diuresed well yesterday Post op thrombocytopenia, mild   Mobilize  Decrease lasix  Coumadin  Watch platelet count  Restart low dose amiodarone  D/C pacing wires tomorrow if rhythm stable  D/C tubes 1-2 days depending on output  Possible D/C home 2-3 days  Transfer step down  Purcell Nails, MD 07/29/2016 11:10 AM

## 2016-07-29 NOTE — Care Management Important Message (Signed)
Important Message  Patient Details  Name: Jeremy Fisher MRN: [id number] Date of Birth: Nov 26, 1940   Medicare Important Message Given:  Yes    Kyla Balzarine 07/29/2016, 2:05 PM

## 2016-07-29 NOTE — Discharge Summary (Signed)
Physician Discharge Summary  Patient ID: Jeremy Fisher MRN: [id number] DOB/AGE: 1940-08-01 76 y.o.  Admit date: 07/27/2016 Discharge date: 07/31/2016  Admission Diagnoses:  Patient Active Problem List   Diagnosis Date Noted  . Patent foramen ovale 07/20/2016  . Severe mitral regurgitation 06/27/2016   Discharge Diagnoses:   Patient Active Problem List   Diagnosis Date Noted  . S/P minimally invasive mitral valve repair 07/27/2016  . S/P patent foramen ovale closure 07/27/2016  . Patent foramen ovale 07/20/2016  . 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 with no previous cardiac history who has been referred for surgical consultation to discuss treatment options for management of mitral valve prolapse with severe primary mitral regurgitation. He suffered a pulmonary embolism approximately 10 years ago and was treated with warfarin anticoagulation for approximately 5 years. He underwent an extensive hematologic workup that was essentially unrevealing. In 2015 he presented with gallstone pancreatitis. He underwent laparoscopic cholecystectomy and later underwent ERCP and recovered uneventfully. He has otherwise been remarkably healthy all of his adult life. He denies any history of hypertension, hyperlipidemia, diabetes mellitus, palpitations, or previous history of heart murmur. He was recently evaluated at the [facility] to establish primary care because he is a service connected veteran havingserved in [place]. He was noted to have a prominent murmur on physical exam and a transthoracic echocardiogram was performed revealing the presence of mitral valve prolapse with a large flail segment of the posterior leaflet of the mitral valve and severe mitral regurgitation.  He was referred to [facility] for surgical evaluation.  He was evaluated by  Dr. [name] who was in agreement the patient would benefit from Mitral Valve Repair.  It was felt this could be done minimally invasively.  The risks and benefits of the procedure were explained to the patient and he was agreeable to proceed.    Hospital Course:   Jeremy Fisher presents to [facility] on 07/27/2016.  He was taken to the operating room and underwent Mitral Valve Repair and Closure of Patent Foramen Ovale.  He tolerated the procedure without difficulty and was taken to the SICU in stable.  The patient was extubated the evening of surgery.  During his stay in the SICU the patient was started on low dose coumadin for his Mitral Valve Repair.  He was diuresed for hypervolemia.  He was experiencing some bigeminy immediately post op which did resolve.  He was maintaining NSR and felt medically stable for transfer to the floor on POD #2. The patient continued to make good progress.  He was maintaining Sinus Bradycardia with rate at 60.  His Amiodarone was discontinued due to this.  He will remain on low dose BB as long as he tolerates.  He remains on Coumadin at 7.5 mg daily.  His most recent INR is 1.09.  He is scheduled for PT/INR check on Wednesday at [facility] heart care office.  He continues to ambulate independently.  His pacing wires have been removed.  His pain is well controlled.  He is felt medically stable for discharge today.    Significant Diagnostic Studies: cardiac graphics:   Treatments: surgery:    Minimally-Invasive Mitral Valve Repair             Complex valvuloplasty including quadrangular resection of posterior leaflet (P2)  Artificial Gore-tex neochord placement x 10             Sorin Memo 3D Ring Annuloplasty (size 38mm, catalog # [id number], serial # [id number])   Closure of Patent Foramen Ovale  Disposition: 01-Home or Self Care   Discharge Medications:  The patient has been discharged on:   1.Beta Blocker:  Yes [ x  ]                              No    [   ]                              If No, reason:  2.Ace Inhibitor/ARB: Yes [   ]                                     No  [ x   ]                                     If No, reason: labile BP 3.Statin:   Yes [   ]                  No  [  x ]                  If No, reason: no CAD  4.Marlowe Kays:  Yes  [ x  ]                  No   [   ]                  If No, reason:    Allergies as of 07/31/2016      Reactions   Primaxin [imipenem] Other (See Comments)   SEIZURES      Medication List    STOP taking these medications   amiodarone 200 MG tablet Commonly known as:  PACERONE     TAKE these medications   acetaminophen 500 MG tablet Commonly known as:  TYLENOL Take 2 tablets (1,000 mg total) by mouth every 6 (six) hours as needed.   aspirin 81 MG EC tablet Take 1 tablet (81 mg total) by mouth daily.   metoprolol tartrate 25 MG tablet Commonly known as:  LOPRESSOR Take 0.5 tablets (12.5 mg total) by mouth 2 (two) times daily.   traMADol 50 MG tablet Commonly known as:  ULTRAM Take 1-2 tablets (50-100 mg total) by mouth every 4 (four) hours as needed for moderate pain.   warfarin 7.5 MG tablet Commonly known as:  COUMADIN Take 1 tablet (7.5 mg total) by mouth daily at 6 PM.      Follow-up Information    Purcell Nails, MD Follow up on 08/15/2016.   Specialty:  Cardiothoracic Surgery Why:  Appointment is at 10:00, please get  Contact information: [address] [place] [address] [phone]        [facility] Office Follow up on 08/02/2016.   Specialty:  Cardiology Why:  For PT/INR check at 10:30  Contact information: [address] [place] [address] [phone]       Tereso Newcomer, PA-C Follow  up on 08/12/2016.   Specialties:  Cardiology, Physician Assistant Why:  Appointment is at 10:45 Contact information: [address] [place] [address] [phone]            Signed: Lowella Dandy 07/31/2016, 10:12 AM

## 2016-07-29 NOTE — Care Management Note (Signed)
Case Management Note Donn Pierini RN, BSN Unit 2W-Case Manager-- 2H coverage [phone]  Patient Details  Name: Jeremy Fisher MRN: [id number] Date of Birth: [birth year]  Subjective/Objective:  Pt admitted s/p mini MVR on 07/27/16                 Action/Plan: PTA pt lived at home with wife- anticipate return home- PCP is [name], MD [facility])- CM to follow for d/c needs  Expected Discharge Date:                  Expected Discharge Plan:  Home/Self Care  In-House Referral:     Discharge planning Services  CM Consult  Post Acute Care Choice:    Choice offered to:     DME Arranged:    DME Agency:     HH Arranged:    HH Agency:     Status of Service:  In process, will continue to follow  If discussed at Long Length of Stay Meetings, dates discussed:    Discharge Disposition:   Additional Comments:  Darrold Span, RN 07/29/2016, 11:31 AM

## 2016-07-29 NOTE — Progress Notes (Signed)
Pt ambulated 600 ft using RW without complaint or tele alarm.  Slow steady gait.  To bathroom, then recliner with call bell in reach.  Will con't plan of care.

## 2016-07-30 LAB — CBC
HEMATOCRIT: 34.2 % — AB (ref 39.0–52.0)
HEMOGLOBIN: 11.9 g/dL — AB (ref 13.0–17.0)
MCH: 29.8 pg (ref 26.0–34.0)
MCHC: 34.8 g/dL (ref 30.0–36.0)
MCV: 85.7 fL (ref 78.0–100.0)
Platelets: 114 10*3/uL — ABNORMAL LOW (ref 150–400)
RBC: 3.99 MIL/uL — ABNORMAL LOW (ref 4.22–5.81)
RDW: 13.7 % (ref 11.5–15.5)
WBC: 9.4 10*3/uL (ref 4.0–10.5)

## 2016-07-30 LAB — PROTIME-INR
INR: 1.11
PROTHROMBIN TIME: 14.4 s (ref 11.4–15.2)

## 2016-07-30 LAB — BASIC METABOLIC PANEL
Anion gap: 7 (ref 5–15)
BUN: 14 mg/dL (ref 6–20)
CALCIUM: 9 mg/dL (ref 8.9–10.3)
CO2: 26 mmol/L (ref 22–32)
Chloride: 105 mmol/L (ref 101–111)
Creatinine, Ser: 0.87 mg/dL (ref 0.61–1.24)
GFR calc Af Amer: >60 mL/min (ref >60)
GLUCOSE: 97 mg/dL (ref 65–99)
POTASSIUM: 3.4 mmol/L — AB (ref 3.5–5.1)
Sodium: 138 mmol/L (ref 135–145)

## 2016-07-30 LAB — GLUCOSE, CAPILLARY: GLUCOSE-CAPILLARY: 91 mg/dL (ref 65–99)

## 2016-07-30 MED ORDER — COUMADIN BOOK
Freq: Once | Status: DC
Start: 2016-07-30 — End: 2016-07-31
  Filled 2016-07-30: qty 1

## 2016-07-30 MED ORDER — WARFARIN VIDEO
Freq: Once | Status: DC
Start: 2016-07-30 — End: 2016-07-31

## 2016-07-30 NOTE — Progress Notes (Addendum)
      [address]       Jeremy Fisher [address]             [phone]      3 Days Post-Op Procedure(s) (LRB): MINIMALLY INVASIVE MITRAL VALVE (MV) REPAIR (Right) TRANSESOPHAGEAL ECHOCARDIOGRAM (TEE) (N/A) REPAIR OF PATENT FORAMEN OVALE (N/A)   Subjective:  Some pain at chest tubes sites, hoping to get those out today.  Otherwise states he is doing well.    Objective: Vital signs in last 24 hours: Temp:  [97.6 F (36.4 C)-98.5 F (36.9 C)] 98.2 F (36.8 C) (04/28 0614) Pulse Rate:  [60-67] 60 (04/28 0614) Cardiac Rhythm: Atrial paced (04/28 0735) Resp:  [18-19] 18 (04/28 0614) BP: (103-142)/(46-76) 121/76 (04/28 0614) SpO2:  [93 %-97 %] 93 % (04/28 0614) Weight:  [170 lb 14.4 oz (77.5 kg)] 170 lb 14.4 oz (77.5 kg) (04/28 0614)  Intake/Output from previous day: 04/27 0701 - 04/28 0700 In: 240 [P.O.:240] Out: 1545 [Urine:1425; Chest Tube:120] Intake/Output this shift: Total I/O In: -  Out: 30 [Chest Tube:30]  General appearance: alert, cooperative and no distress Heart: regular rate and rhythm Lungs: clear to auscultation bilaterally Abdomen: soft, non-tender; bowel sounds normal; no masses,  no organomegaly Extremities: edema no edema present Wound: clean and dry  Lab Results:  Recent Labs  07/29/16 0312 07/30/16 0402  WBC 10.0 9.4  HGB 11.5* 11.9*  HCT 33.3* 34.2*  PLT 99* 114*   BMET:  Recent Labs  07/29/16 0312 07/30/16 0402  NA 135 138  K 3.6 3.4*  CL 104 105  CO2 27 26  GLUCOSE 108* 97  BUN 13 14  CREATININE 0.96 0.87  CALCIUM 8.8* 9.0    PT/INR:  Recent Labs  07/30/16 0402  LABPROT 14.4  INR 1.11   ABG    Component Value Date/Time   PHART 7.385 07/27/2016 2129   HCO3 20.9 07/27/2016 2129   TCO2 28 07/28/2016 1736   ACIDBASEDEF 4.0 (H) 07/27/2016 2129   O2SAT 99.0 07/27/2016 2129   CBG (last 3)   Recent Labs  07/29/16 0844 07/29/16 1209 07/30/16 0615  GLUCAP 100* 96 91    Assessment/Plan: S/P  Procedure(s) (LRB): MINIMALLY INVASIVE MITRAL VALVE (MV) REPAIR (Right) TRANSESOPHAGEAL ECHOCARDIOGRAM (TEE) (N/A) REPAIR OF PATENT FORAMEN OVALE (N/A)  1. CV- Sinus Brady, A Paced, mild HTN- will stop A Pacing, D/C Amiodarone, continue Lopressor for BP control 2. INR 1.11 3. Pulm- no acute issues, CT output 120 cc yesterday- will d/c today, off oxygen, continue IS repeat CXR in AM 4. Renal- creatinine WNL, weight is trending down, no LE edema on exam- continue Lasix as ordered 5. Thrombocytopenia- Plt count improving, up to 114K 6. Dispo- patient doing well, maintaining Sinus Huston Foley, will turn off A Pacing, D/C Amiodarone, will watch rhythm today if remains stable will d/c EPW in AM, d/c chest tubes, continue coumadin   LOS: 3 days    Lowella Dandy 07/30/2016   Chart reviewed, patient examined, agree with above.  Sinus 60 today. Agree with stopping amio and continuing Lopressor. Home tomorrow if no changes.

## 2016-07-30 NOTE — Progress Notes (Signed)
CARDIAC REHAB PHASE I   PRE:  Rate/Rhythm: 64 SR  BP:  Supine:   Sitting: 116/62  Standing:    SaO2: 98% RA  MODE:  Ambulation: 850 ft   POST:  Rate/Rhythm: 59  BP:  Supine:   Sitting: 138/62  Standing:    SaO2: 99% RA  [id number] Patient tolerated ambulation well with assist x1, no c/o, VSS. To chair after walk. Recovering from cardiac surgery education reviewed with patient and patient's wife including restrictions, IS use and activity progression, heart healthy diet handout given. Pt states he's read the recovering from cardiac surgery book. Pt verbalizes understanding of information given. Discussed Phase 2 cardiac rehab, but pt declines at this time, plans to walk at home.  Cristy Hilts, MS, ACSM CEP

## 2016-07-30 NOTE — Discharge Instructions (Signed)
Mitral Valve Repair, Care After This sheet gives you information about how to care for yourself after your procedure. Your health care provider may also give you more specific instructions. If you have problems or questions, contact your health care provider. What can I expect after the procedure? After the procedure, it is common to have:  Pain at the incision area that may last for several weeks. Follow these instructions at home: Incision care   Follow instructions from your health care provider about how to take care of your incision. Make sure you:  Wash your hands with soap and water before you change your bandage (dressing). If soap and water are not available, use hand sanitizer.  Change your dressing as told by your health care provider.  Leave stitches (sutures), skin glue, or adhesive strips in place. These skin closures may need to stay in place for 2 weeks or longer. If adhesive strip edges start to loosen and curl up, you may trim the loose edges. Do not remove adhesive strips completely unless your health care provider tells you to do that.  Check your incision area every day for signs of infection. Check for:  More redness, swelling, or pain.  More fluid or blood.  Warmth.  Pus or a bad smell.   Do not apply powder or lotion to the area. Driving   Do not drive until your health care provider approves.  Do not drive or use heavy machinery while taking prescription pain medicines. Bathing   Do not take baths, swim, or use a hot tub for 2-4 weeks after surgery, or until your health care provider approves. Ask your health care provider if you may take showers.  To wash the incision site, gently wash with soap and water and pat the area dry with a clean towel. Do not rub the incision area. That may cause bleeding. Activity   Rest as told by your health care provider. Ask your health care provider when you can resume normal activities, including sexual  activity.  Avoid the following activities for 6-8 weeks, or as long as directed:  Lifting anything that is heavier than 10 lb (4.5 kg), or the limit that your health care provider tells you.  Pushing or pulling things with your arms.  Avoid climbing stairs and using the handrail to pull yourself up for the first 2-3 weeks after surgery.  Avoid airplane travel for 4-6 weeks, or as long as directed.  Avoid sitting for long periods of time and crossing your legs. Get up and move around at least once every 1-2 hours.  If you are taking blood thinners (anticoagulants), avoid activities that have a high risk of injury. Ask your health care provider what activities are safe for you. Lifestyle   Limit alcohol intake to no more than 1 drink a day for nonpregnant women and 2 drinks a day for men. One drink equals 12 oz of beer, 5 oz of wine, or 1 oz of hard liquor.  Do not use any products that contain nicotine or tobacco, such as cigarettes and e-cigarettes. If you need help quitting, ask your health care provider. General instructions   Take your temperature every day and weigh yourself every morning for the first 7 days after surgery. Write your temperatures and weight down and take this record with you to any follow-up visits.  Take over-the-counter and prescription medicines only as told by your health care provider.  To prevent or treat constipation while you are taking prescription  pain medicine, your health care provider may recommend that you:  Drink enough fluid to keep your urine clear or pale yellow.  Take over-the-counter or prescription medicines.  Eat foods that are high in fiber, such as fresh fruits and vegetables, whole grains, and beans.  Limit foods that are high in fat and processed sugars, such as fried and sweet foods.  Follow instructions from your health care provider about eating or drinking restrictions.  Wear compression stockings for at least 2 weeks, or as  long as told by your health care provider. These stockings help to prevent blood clots and reduce swelling in your legs. If your ankles are swollen after 2 weeks, continue to wear the stockings.  Keep all follow-up visits as told by your health care provider. This is important. Contact a health care provider if:  You develop a skin rash.  Your weight is increasing each day over 2-3 days.  You gain 2 lb (1 kg) or more in a single day.  You have a fever. Get help right away if:  You develop chest pain that feels different from the pain caused by your incision.  You develop shortness of breath or difficulty breathing.  You have more redness, swelling, or pain around your incision.  You have more fluid or blood coming from your incision.  Your incision feels warm to the touch.  You have pus or a bad smell coming from your incision.  You feel light-headed. This information is not intended to replace advice given to you by your health care provider. Make sure you discuss any questions you have with your health care provider. Document Released: 10/08/2004 Document Revised: 01/01/2016 Document Reviewed: 01/01/2016 Elsevier Interactive Patient Education  2017 [employer].   Information on my medicine - Coumadin   (Warfarin)  This medication education was reviewed with me or my healthcare representative as part of my discharge preparation.  The pharmacist that spoke with me during my hospital stay was:  Fredrik Rigger, [facility]  Why was Coumadin prescribed for you? Coumadin was prescribed for you because you have a blood clot or a medical condition that can cause an increased risk of forming blood clots. Blood clots can cause serious health problems by blocking the flow of blood to the heart, lung, or brain. Coumadin can prevent harmful blood clots from forming. As a reminder your indication for Coumadin is:   Blood Clot Prevention After Heart Valve Surgery  What test will check on  my response to Coumadin? While on Coumadin (warfarin) you will need to have an INR test regularly to ensure that your dose is keeping you in the desired range. The INR (international normalized ratio) number is calculated from the result of the laboratory test called prothrombin time (PT).  If an INR APPOINTMENT HAS NOT ALREADY BEEN MADE FOR YOU please schedule an appointment to have this lab work done by your health care provider within 7 days. Your INR goal is usually a number between:  2 to 3 or your provider may give you a more narrow range like 2-2.5.  Ask your health care provider during an office visit what your goal INR is.  What  do you need to  know  About  COUMADIN? Take Coumadin (warfarin) exactly as prescribed by your healthcare provider about the same time each day.  DO NOT stop taking without talking to the doctor who prescribed the medication.  Stopping without other blood clot prevention medication to take the place of  Coumadin may increase your risk of developing a new clot or stroke.  Get refills before you run out.  What do you do if you miss a dose? If you miss a dose, take it as soon as you remember on the same day then continue your regularly scheduled regimen the next day.  Do not take two doses of Coumadin at the same time.  Important Safety Information A possible side effect of Coumadin (Warfarin) is an increased risk of bleeding. You should call your healthcare provider right away if you experience any of the following: ? Bleeding from an injury or your nose that does not stop. ? Unusual colored urine (red or dark brown) or unusual colored stools (red or black). ? Unusual bruising for unknown reasons. ? A serious fall or if you hit your head (even if there is no bleeding).  Some foods or medicines interact with Coumadin (warfarin) and might alter your response to warfarin. To help avoid this: ? Eat a balanced diet, maintaining a consistent amount of Vitamin  K. ? Notify your provider about major diet changes you plan to make. ? Avoid alcohol or limit your intake to 1 drink for women and 2 drinks for men per day. (1 drink is 5 oz. wine, 12 oz. beer, or 1.5 oz. liquor.)  Make sure that ANY health care provider who prescribes medication for you knows that you are taking Coumadin (warfarin).  Also make sure the healthcare provider who is monitoring your Coumadin knows when you have started a new medication including herbals and non-prescription products.  Coumadin (Warfarin)  Major Drug Interactions  Increased Warfarin Effect Decreased Warfarin Effect  Alcohol (large quantities) Antibiotics (esp. Septra/Bactrim, Flagyl, Cipro) Amiodarone (Cordarone) Aspirin (ASA) Cimetidine (Tagamet) Megestrol (Megace) NSAIDs (ibuprofen, naproxen, etc.) Piroxicam (Feldene) Propafenone (Rythmol SR) Propranolol (Inderal) Isoniazid (INH) Posaconazole (Noxafil) Barbiturates (Phenobarbital) Carbamazepine (Tegretol) Chlordiazepoxide (Librium) Cholestyramine (Questran) Griseofulvin Oral Contraceptives Rifampin Sucralfate (Carafate) Vitamin K   Coumadin (Warfarin) Major Herbal Interactions  Increased Warfarin Effect Decreased Warfarin Effect  Garlic Ginseng Ginkgo biloba Coenzyme Q10 Green tea St. Johns wort    Coumadin (Warfarin) FOOD Interactions  Eat a consistent number of servings per week of foods HIGH in Vitamin K (1 serving =  cup)  Collards (cooked, or boiled & drained) Kale (cooked, or boiled & drained) Mustard greens (cooked, or boiled & drained) Parsley *serving size only =  cup Spinach (cooked, or boiled & drained) Swiss chard (cooked, or boiled & drained) Turnip greens (cooked, or boiled & drained)  Eat a consistent number of servings per week of foods MEDIUM-HIGH in Vitamin K (1 serving = 1 cup)  Asparagus (cooked, or boiled & drained) Broccoli (cooked, boiled & drained, or raw & chopped) Brussel sprouts (cooked, or boiled &  drained) *serving size only =  cup Lettuce, raw (green leaf, endive, romaine) Spinach, raw Turnip greens, raw & chopped   These websites have more information on Coumadin (warfarin):  http://www.king-russell.com/; https://www.hines.net/;

## 2016-07-31 ENCOUNTER — Inpatient Hospital Stay (HOSPITAL_COMMUNITY): Payer: Medicare Other

## 2016-07-31 LAB — PROTIME-INR
INR: 1.09
Prothrombin Time: 14.2 seconds (ref 11.4–15.2)

## 2016-07-31 LAB — CBC
HCT: 40 % (ref 39.0–52.0)
Hemoglobin: 13.8 g/dL (ref 13.0–17.0)
MCH: 29.6 pg (ref 26.0–34.0)
MCHC: 34.5 g/dL (ref 30.0–36.0)
MCV: 85.8 fL (ref 78.0–100.0)
Platelets: 153 10*3/uL (ref 150–400)
RBC: 4.66 MIL/uL (ref 4.22–5.81)
RDW: 13.7 % (ref 11.5–15.5)
WBC: 8.9 10*3/uL (ref 4.0–10.5)

## 2016-07-31 LAB — GLUCOSE, CAPILLARY: Glucose-Capillary: 113 mg/dL — ABNORMAL HIGH (ref 65–99)

## 2016-07-31 MED ORDER — WARFARIN SODIUM 7.5 MG PO TABS: 7.5000 mg | ORAL_TABLET | Freq: Every day | ORAL | 3 refills | Status: DC

## 2016-07-31 MED ORDER — ACETAMINOPHEN 500 MG PO TABS: 1000.0000 mg | ORAL_TABLET | Freq: Four times a day (QID) | ORAL | 0 refills | Status: DC | PRN

## 2016-07-31 MED ORDER — ASPIRIN 81 MG PO TBEC: 81.0000 mg | DELAYED_RELEASE_TABLET | Freq: Every day | ORAL | Status: AC

## 2016-07-31 MED ORDER — WARFARIN SODIUM 5 MG PO TABS: 5.0000 mg | ORAL_TABLET | Freq: Every day | ORAL | 3 refills | Status: DC

## 2016-07-31 MED ORDER — METOPROLOL TARTRATE 25 MG PO TABS: 12.5000 mg | ORAL_TABLET | Freq: Two times a day (BID) | ORAL | 3 refills | Status: DC

## 2016-07-31 MED ORDER — TRAMADOL HCL 50 MG PO TABS: 50.0000 mg | ORAL_TABLET | ORAL | 0 refills | Status: DC | PRN

## 2016-07-31 NOTE — Care Management Note (Signed)
Original Note Created by : Case Management Note Donn Pierini RN, BSN Unit 2W-Case Manager-- 2H coverage [phone]  Patient Details  Name: Jeremy Fisher MRN: [id number] Date of Birth: [birth year]  Subjective/Objective:  Pt admitted s/p mini MVR on 07/27/16                 Action/Plan: PTA pt lived at home with wife- anticipate return home- PCP is Jeremy Daniel, MD [facility])- CM to follow for d/c needs  Expected Discharge Date:  07/31/16               Expected Discharge Plan:  Home/Self Care  In-House Referral:     Discharge planning Services  CM Consult  Post Acute Care Choice:    Choice offered to:     DME Arranged:    DME Agency:     HH Arranged:    HH Agency:     Status of Service:  In process, will continue to follow  If discussed at Long Length of Stay Meetings, dates discussed:    Discharge Disposition:   Additional Comments: 07/31/2016 Pt to discharge home today with wife.  Pt states his primary physician resides in [place] where he also has home.  Pt would like to remain with that PCP and follow up with Dr. Cornelius Fisher as needed while in [place].  Pt denied barriers to obtaining prescribed medications.  NO CM needs determined prior to discharge Jeremy Parr, RN 07/31/2016, 8:09 AM

## 2016-07-31 NOTE — Progress Notes (Signed)
Patient walked the complete floor three times last night for a total of 1650 feet

## 2016-07-31 NOTE — Progress Notes (Addendum)
      [address]       [place] [address]             [phone]      4 Days Post-Op Procedure(s) (LRB): MINIMALLY INVASIVE MITRAL VALVE (MV) REPAIR (Right) TRANSESOPHAGEAL ECHOCARDIOGRAM (TEE) (N/A) REPAIR OF PATENT FORAMEN OVALE (N/A)   Subjective:  Dr. Zachery Dakins has no complaints.  Wants to go home today.  + ambulation + BM  Objective: Vital signs in last 24 hours: Temp:  [97.3 F (36.3 C)-97.9 F (36.6 C)] 97.3 F (36.3 C) (04/29 0539) Pulse Rate:  [60-63] 60 (04/29 0539) Cardiac Rhythm: Heart block (04/29 0700) Resp:  [18] 18 (04/29 0539) BP: (110-118)/(51-59) 118/59 (04/29 0539) SpO2:  [95 %-96 %] 95 % (04/29 0539) Weight:  [170 lb 14.4 oz (77.5 kg)] 170 lb 14.4 oz (77.5 kg) (04/29 0522)  Intake/Output from previous day: 04/28 0701 - 04/29 0700 In: -  Out: 30 [Chest Tube:30]  General appearance: alert, cooperative and no distress Heart: regular rate and rhythm Lungs: clear to auscultation bilaterally Abdomen: soft, non-tender; bowel sounds normal; no masses,  no organomegaly Extremities: edema none appreciated Wound: clean and dry  Lab Results:  Recent Labs  07/29/16 0312 07/30/16 0402  WBC 10.0 9.4  HGB 11.5* 11.9*  HCT 33.3* 34.2*  PLT 99* 114*   BMET:  Recent Labs  07/29/16 0312 07/30/16 0402  NA 135 138  K 3.6 3.4*  CL 104 105  CO2 27 26  GLUCOSE 108* 97  BUN 13 14  CREATININE 0.96 0.87  CALCIUM 8.8* 9.0    PT/INR:  Recent Labs  07/30/16 0402  LABPROT 14.4  INR 1.11   ABG    Component Value Date/Time   PHART 7.385 07/27/2016 2129   HCO3 20.9 07/27/2016 2129   TCO2 28 07/28/2016 1736   ACIDBASEDEF 4.0 (H) 07/27/2016 2129   O2SAT 99.0 07/27/2016 2129   CBG (last 3)   Recent Labs  07/29/16 1209 07/30/16 0615 07/31/16 0633  GLUCAP 96 91 113*    Assessment/Plan: S/P Procedure(s) (LRB): MINIMALLY INVASIVE MITRAL VALVE (MV) REPAIR (Right) TRANSESOPHAGEAL ECHOCARDIOGRAM (TEE) (N/A) REPAIR OF PATENT  FORAMEN OVALE (N/A)  1. CV- Sinus Bradycardia, now with 1st degree AV Block, BP in the 110s- may benefit from discontinuation of Lopressor, will discuss with Dr. Renato Battles 2. Pulm- no acute issues, continue IS, CXR is stable, with minimal apical pneumothorax, mild atelectasis 3. Renal- creatinine has been stable, no hypervolemia, Lasix course completed 4. Dispo- patient stable, will discuss possible discontinuation of BB with Dr. Laneta Simmers, d/c EPW today, remove central line... If has no arrythmia after 4 hours of observation can be discharged later today... Will review INR once completed, if no increase will d/c on 7.5 mg of coumadin   LOS: 4 days    Raford Pitcher, Ozzy Bohlken 07/31/2016

## 2016-07-31 NOTE — Progress Notes (Signed)
Central line removed from left neck without complication.  Occlusive dressing applied and pressure held for 5 mins.  Pt tolerated well, understands bedrest for 30 mins.  Wife at bedside.  Will DC home when bedrest complete.

## 2016-07-31 NOTE — Progress Notes (Signed)
EPWs pulled per order and unit protocol.  Pt tolerated well, all tips intact, site with mild ooze, painted and covered with gauze.  Pt understands bedrest for one hr with frequent VS checks.  Stable, CCMD notified, will monitor closely.

## 2016-08-02 ENCOUNTER — Ambulatory Visit (INDEPENDENT_AMBULATORY_CARE_PROVIDER_SITE_OTHER): Payer: Medicare Other | Admitting: *Deleted

## 2016-08-02 DIAGNOSIS — Z5181 Encounter for therapeutic drug level monitoring: Secondary | ICD-10-CM

## 2016-08-02 DIAGNOSIS — Z9889 Other specified postprocedural states: Secondary | ICD-10-CM | POA: Diagnosis not present

## 2016-08-02 LAB — POCT INR: INR: 1.6

## 2016-08-02 NOTE — Patient Instructions (Signed)

## 2016-08-03 ENCOUNTER — Telehealth (HOSPITAL_COMMUNITY): Payer: Self-pay | Admitting: Physician Assistant

## 2016-08-03 NOTE — Telephone Encounter (Signed)
      [address]       [place] [address]             [phone]    Jeremy Fisher [id number]   S/P minimally invasive mitral valve repair and closeure of patent foramen ovale by Dr. [name] on 07/27/2016.  He was discharged home on 07/31/2016.  Medications: acetaminophen 500 MG tablet Commonly known as:  TYLENOL Take 2 tablets (1,000 mg total) by mouth every 6 (six) hours as needed.   aspirin 81 MG EC tablet Take 1 tablet (81 mg total) by mouth daily.   metoprolol tartrate 25 MG tablet Commonly known as:  LOPRESSOR Take 0.5 tablets (12.5 mg total) by mouth 2 (two) times daily.   traMADol 50 MG tablet Commonly known as:  ULTRAM Take 1-2 tablets (50-100 mg total) by mouth every 4 (four) hours as needed for moderate pain.   warfarin 7.5 MG tablet Commonly known as:  COUMADIN Take 1 tablet (7.5 mg total) by mouth daily at 6 PM.    On coumadin and INR yesterday was 1.6 (up from 1.11 at discharge)  Problems/Concerns:  Assessment:  Patient states he is doing very well.He has been walking several times daily without difficulty.  He is only taking Tylenol PRN pain. He did not fill the Ultram prescription. He requested to be able to drive. I told him we usually release him to do so after he is seen in the office. He is requesting to drive now. I told him he may begin driving short distances (I.e. 20 minutes or less during the day) starting this weekend.   He was instructed to contact office if concerns or problems develop  Follow up Appointments:  1. 08/09/2016 at 7:30 am for PT and INR 2.  Tereso Newcomer, PA-C Follow up on 08/12/2016.   Specialties:  Cardiology, Physician Assistant Why:  Appointment is at 10:45 3. Purcell Nails, MD Follow up on 08/15/2016.   Specialty:  Cardiothoracic Surgery Why:  Appointment is at 10:00

## 2016-08-08 ENCOUNTER — Other Ambulatory Visit: Payer: Self-pay | Admitting: Thoracic Surgery (Cardiothoracic Vascular Surgery)

## 2016-08-08 DIAGNOSIS — Z9889 Other specified postprocedural states: Secondary | ICD-10-CM

## 2016-08-09 ENCOUNTER — Ambulatory Visit (INDEPENDENT_AMBULATORY_CARE_PROVIDER_SITE_OTHER): Payer: Medicare Other | Admitting: *Deleted

## 2016-08-09 DIAGNOSIS — Z5181 Encounter for therapeutic drug level monitoring: Secondary | ICD-10-CM | POA: Diagnosis not present

## 2016-08-09 DIAGNOSIS — Z9889 Other specified postprocedural states: Secondary | ICD-10-CM | POA: Diagnosis not present

## 2016-08-09 LAB — POCT INR: INR: 4.3

## 2016-08-12 ENCOUNTER — Ambulatory Visit (INDEPENDENT_AMBULATORY_CARE_PROVIDER_SITE_OTHER): Payer: Medicare Other | Admitting: Physician Assistant

## 2016-08-12 ENCOUNTER — Encounter: Payer: Self-pay | Admitting: Physician Assistant

## 2016-08-12 VITALS — BP 136/74 | HR 79 | Ht 69.0 in | Wt 174.0 lb

## 2016-08-12 DIAGNOSIS — I059 Rheumatic mitral valve disease, unspecified: Secondary | ICD-10-CM | POA: Diagnosis not present

## 2016-08-12 DIAGNOSIS — Z9889 Other specified postprocedural states: Secondary | ICD-10-CM | POA: Diagnosis not present

## 2016-08-12 NOTE — Progress Notes (Signed)
Cardiology Office Note:    Date:  08/12/2016   ID:  Jeremy Fisher, DOB 15-Nov-1940, MRN [id number]  PCP:  Patient, No Pcp Per  Cardiologist:  Dr. Tonny Bollman    Referring MD: No ref. provider found   Chief Complaint  Patient presents with  . Hospitalization Follow-up    s/p MV repair    History of Present Illness:    DEDRICK Fisher is a 76 y.o. 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 He was noted to have a murmur on exam at the [facility] and an echocardiogram demonstrated mitral valve prolapse with a large flail segment of the posterior leaflet with associated severe MR. He was referred to Dr. [name] for surgical consultation. He was referred to our service for further evaluation. TEE on 4/18 demonstrated severe MR with myxomatous appearing mitral valve with partial flail of the P2 segment and possible small secundum ASD. Right and left heart catheterization by Dr. Excell Seltzer on 4/18 demonstrated no significant CAD.  He was admitted 4/25-4/29. He underwent minimally invasive mitral valve repair and closure of PFO by Dr. [name]. He had been placed on amiodarone prior to his surgery. This was stopped secondary to bradycardia. He was placed on Coumadin.  He returns for follow up.  He is here with his wife.  He sees Dr. [name] next week.  Since DC from the hospital, he denies chest pain, shortness of breath, syncope, orthopnea, PND or significant pedal edema.  He has noted higher HRs after walking.  But, he denies rapid palpitations.  Denies bleeding issues.   Prior CV studies:   The following studies were reviewed today:  Intraoperative TEE 07/27/16 EF 60-65, normal wall motion, 1+ PI, moderate to severe MR due to flail posterior leaflet involving P2 segment  R/L heart cath 07/20/16 LAD proximal 25, mid 25-intramyocardial bridging LCx irregularities LVEDP 13  next line mean RA 3 PASP 35 Mean PA 19 Mean PWCP 9  TEE 07/20/16 EF 60-65, normal wall motion, normal aorta with minimal plaque, myxomatous appearing mitral valve with partial flail P2 segment with severe MR, moderate LAE, probable small secundum ASD, RV-RA gradient 27  Past Medical History:  Diagnosis Date  . Mitral valve prolapse   . Patent foramen ovale 07/20/2016  . Pulmonary embolism (HCC) 2007   was on Coumadin but has been off for 5 yrs   . S/P patent foramen ovale closure 07/27/2016  . Severe mitral regurgitation     Past Surgical History:  Procedure Laterality Date  . CHOLECYSTECTOMY N/A 06/17/2013   Procedure: LAPAROSCOPIC CHOLECYSTECTOMY WITH INTRAOPERATIVE CHOLANGIOGRAM;  Surgeon: Mariella Saa, MD;  Location: [facility] ORS;  Service: General;  Laterality: N/A;  . ERCP N/A 05/22/2014   Procedure: ENDOSCOPIC RETROGRADE CHOLANGIOPANCREATOGRAPHY (ERCP);  Surgeon: Louis Meckel, MD;  Location[facility] ENDOSCOPY;  Service: Endoscopy;  Laterality: N/A;  . EYE SURGERY     right eye  . HERNIA REPAIR Left   . MITRAL VALVE REPLACEMENT Right 07/27/2016   Procedure: MINIMALLY INVASIVE MITRAL VALVE (MV) REPAIR;  Surgeon: Purcell Nails, MD;  Location: [facility] OR;  Service: Open Heart Surgery;  Laterality: Right;  . REPAIR OF PATENT FORAMEN OVALE N/A 07/27/2016   Procedure: REPAIR OF PATENT FORAMEN OVALE;  Surgeon: Purcell Nails, MD;  Location: [facility] OR;  Service: Open Heart Surgery;  Laterality: N/A;  . RIGHT/LEFT HEART CATH AND CORONARY ANGIOGRAPHY N/A 07/20/2016  Procedure: Right/Left Heart Cath and Coronary Angiography;  Surgeon: Tonny Bollman, MD;  Location: [facility] INVASIVE CV LAB;  Service: Cardiovascular;  Laterality: N/A;  . SPYGLASS CHOLANGIOSCOPY N/A 05/22/2014   Procedure: QDUKRCVK CHOLANGIOSCOPY;  Surgeon: Louis Meckel, MD;  Location: [facility] ENDOSCOPY;  Service: Endoscopy;  Laterality: N/A;  . TEE WITHOUT CARDIOVERSION N/A 07/20/2016   Procedure: TRANSESOPHAGEAL ECHOCARDIOGRAM (TEE);   Surgeon: Laurey Morale, MD;  Location: [facility] ENDOSCOPY;  Service: Cardiovascular;  Laterality: N/A;  . TEE WITHOUT CARDIOVERSION N/A 07/27/2016   Procedure: TRANSESOPHAGEAL ECHOCARDIOGRAM (TEE);  Surgeon: Purcell Nails, MD;  Location: [facility] OR;  Service: Open Heart Surgery;  Laterality: N/A;    Current Medications: Current Meds  Medication Sig  . aspirin EC 81 MG EC tablet Take 1 tablet (81 mg total) by mouth daily.  . metoprolol tartrate (LOPRESSOR) 25 MG tablet Take 0.5 tablets (12.5 mg total) by mouth 2 (two) times daily.  Marland Kitchen warfarin (COUMADIN) 7.5 MG tablet Take as directed by coumadin clinic     Allergies:   Primaxin [imipenem]   Social History   Social History  . Marital status: Married    Spouse name: N/A  . Number of children: N/A  . Years of education: N/A   Social History Main Topics  . Smoking status: Never Smoker  . Smokeless tobacco: Never Used  . Alcohol use Yes     Comment: rarely  . Drug use: No  . Sexual activity: No   Other Topics Concern  . None   Social History Narrative  . None     Family Hx: The patient's family history includes Heart disease in his father; Pulmonary fibrosis in his father.  ROS:   Please see the history of present illness.    ROS All other systems reviewed and are negative.   EKGs/Labs/Other Test Reviewed:    EKG:  EKG is  ordered today.  The ekg ordered today demonstrates NSR, HR 79, normal axis, NSSTTW changes, QTc 451 ms, 1st degree AVB, PR 268 ms  Recent Labs: 07/25/2016: ALT 17 07/28/2016: Magnesium 2.1 07/30/2016: BUN 14; Creatinine, Ser 0.87; Potassium 3.4; Sodium 138 07/31/2016: Hemoglobin 13.8; Platelets 153   Recent Lipid Panel No results found for: CHOL, TRIG, HDL, CHOLHDL, VLDL, LDLCALC, LDLDIRECT   Physical Exam:    VS:  BP 136/74   Pulse 79   Ht 5\' 9"  (1.753 m)   Wt 174 lb (78.9 kg)   BMI 25.70 kg/m     Wt Readings from Last 3 Encounters:  08/12/16 174 lb (78.9 kg)  07/31/16 170 lb 14.4 oz (77.5 kg)    07/25/16 176 lb (79.8 kg)     Physical Exam  Constitutional: He is oriented to person, place, and time. He appears well-developed and well-nourished. No distress.  HENT:  Head: Normocephalic and atraumatic.  Eyes: No scleral icterus.  Neck: Normal range of motion. No JVD present.  Cardiovascular: Normal rate, regular rhythm, S1 normal and S2 normal.   No murmur heard. Pulmonary/Chest: Effort normal and breath sounds normal. He has no wheezes. He has no rhonchi. He has no rales.  Right thoracotomy wound well healed without erythema or discharge  Abdominal: Soft. There is no tenderness.  Musculoskeletal: He exhibits no edema.  Neurological: He is alert and oriented to person, place, and time.  Skin: Skin is warm and dry.  Psychiatric: He has a normal mood and affect.    ASSESSMENT:    1. S/P minimally invasive mitral valve repair  PLAN:    In order of problems listed above:  1. S/P minimally invasive mitral valve repair [facility] records and discharge summary reviewed today.  He is progressing well since his minimally invasive mitral valve repair. He has been exercising/walking without much difficulty. He is maintaining normal sinus rhythm. He will likely remain on warfarin for a total of 3 months postoperatively. He spends the proximal half of the year in [place], [place]. He has a cardiologist there as well.  -  Continue aspirin, Coumadin  -  Continue metoprolol for now.  -  Arrange follow-up echocardiogram  -  He is not interested in formal rehabilitation  -  He understands the need for SBE prophylaxis   Dispo:  Return in about 3 months (around 11/12/2016) for Routine Follow Up, w/ Dr. Excell Seltzer.   Medication Adjustments/Labs and Tests Ordered: Current medicines are reviewed at length with the patient today.  Concerns regarding medicines are outlined above.  Orders/Tests:  Orders Placed This Encounter  Procedures  . EKG 12-Lead  . ECHOCARDIOGRAM COMPLETE    Medication changes: No orders of the defined types were placed in this encounter.  Signed, Tereso Newcomer, PA-C  08/12/2016 11:26 AM    [facility] [address] [place], [place], [place]  [address] Phone: [phone]; Fax: [phone]

## 2016-08-12 NOTE — Patient Instructions (Signed)
Medication Instructions:    Your physician recommends that you continue on your current medications as directed. Please refer to the Current Medication list given to you today.     If you need a refill on your cardiac medications before your next appointment, please call your pharmacy.  Labwork: NONE ORDERED  TODAY    Testing/Procedures:  END OF MAY FIRST OF JUNE Your physician has requested that you have an echocardiogram. Echocardiography is a painless test that uses sound waves to create images of your heart. It provides your doctor with information about the size and shape of your heart and how well your heart's chambers and valves are working. This procedure takes approximately one hour. There are no restrictions for this procedure.     Follow-Up: IN 3 MONTHS WITH DR Excell Seltzer    Any Other Special Instructions Will Be Listed Below (If Applicable).

## 2016-08-15 ENCOUNTER — Ambulatory Visit
Admission: RE | Admit: 2016-08-15 | Discharge: 2016-08-15 | Disposition: A | Discharge status: Home / Self Care | Payer: Medicare Other | Source: Ambulatory Visit | Attending: Thoracic Surgery (Cardiothoracic Vascular Surgery) | Admitting: Thoracic Surgery (Cardiothoracic Vascular Surgery)

## 2016-08-15 ENCOUNTER — Encounter: Payer: Self-pay | Admitting: Thoracic Surgery (Cardiothoracic Vascular Surgery)

## 2016-08-15 ENCOUNTER — Ambulatory Visit (INDEPENDENT_AMBULATORY_CARE_PROVIDER_SITE_OTHER): Payer: Self-pay | Admitting: Thoracic Surgery (Cardiothoracic Vascular Surgery)

## 2016-08-15 VITALS — BP 114/74 | HR 86 | Resp 16 | Ht 69.0 in | Wt 174.0 lb

## 2016-08-15 DIAGNOSIS — Q2112 Patent foramen ovale: Secondary | ICD-10-CM

## 2016-08-15 DIAGNOSIS — Z952 Presence of prosthetic heart valve: Secondary | ICD-10-CM | POA: Diagnosis not present

## 2016-08-15 DIAGNOSIS — Z9889 Other specified postprocedural states: Secondary | ICD-10-CM

## 2016-08-15 DIAGNOSIS — I340 Nonrheumatic mitral (valve) insufficiency: Secondary | ICD-10-CM

## 2016-08-15 DIAGNOSIS — Q211 Atrial septal defect: Secondary | ICD-10-CM

## 2016-08-15 DIAGNOSIS — Z8774 Personal history of (corrected) congenital malformations of heart and circulatory system: Secondary | ICD-10-CM

## 2016-08-15 NOTE — Patient Instructions (Addendum)
You may continue to gradually increase your physical activity as tolerated.  Refrain from any heavy lifting or strenuous use of your arms and shoulders until at least 8 weeks from the time of your surgery, and avoid activities that cause increased pain in your chest on the side of your surgical incision.  Otherwise you may continue to increase activities without any particular limitations.  Increase the intensity and duration of physical activity gradually.  You may return to driving an automobile as long as you are no longer requiring oral narcotic pain relievers during the daytime.  It would be wise to start driving only short distances during the daylight and gradually increase from there as you feel comfortable.  Continue all previous medications without any changes at this time  Endocarditis is a potentially serious infection of heart valves or inside lining of the heart.  It occurs more commonly in patients with diseased heart valves (such as patient's with aortic or mitral valve disease) and in patients who have undergone heart valve repair or replacement.  Certain surgical and dental procedures may put you at risk, such as dental cleaning, other dental procedures, or any surgery involving the respiratory, urinary, gastrointestinal tract, gallbladder or prostate gland.   To minimize your chances for develooping endocarditis, maintain good oral health and seek prompt medical attention for any infections involving the mouth, teeth, gums, skin or urinary tract.    Always notify your doctor or dentist about your underlying heart valve condition before having any invasive procedures. You will need to take antibiotics before certain procedures, including all routine dental cleanings or other dental procedures.  Your cardiologist or dentist should prescribe these antibiotics for you to be taken ahead of time.

## 2016-08-15 NOTE — Progress Notes (Signed)
[address]       [place] [address]             [phone]     CARDIOTHORACIC SURGERY OFFICE NOTE  Primary Cardiologist is Tonny Bollman, MD  Referring Provider is Kristine Garbe. Lucretia Roers, MD [facility]) PCP is [name], MD [facility])  HPI:  Patient is a 76 year old gentleman who returns to the office today for routine follow-up status post minimally invasive mitral valve repair with closure of patent foramen ovale on 07/27/2016 for mitral valve prolapse with severe symptomatic primary mitral regurgitation. His postoperative recovery has been entirely uneventful. Since hospital discharge she has been seen in follow-up by Tereso Newcomer at [facility] on 08/12/2016. He has been scheduled for routine follow-up echocardiogram later this month. He returns for office today and reports that he is doing exceptionally well. He has had minimal discomfort from his chest incision, never took any oral narcotic pain relievers, and stop taking Tylenol 2 weeks ago. He reports no shortness of breath and he is walking every day. His exercise tolerance is already noticeably better than it was prior to surgery. He is delighted with his progress. He has not had any problems or complications related to Coumadin anticoagulation.   Current Outpatient Prescriptions  Medication Sig Dispense Refill  . aspirin EC 81 MG EC tablet Take 1 tablet (81 mg total) by mouth daily.    . metoprolol tartrate (LOPRESSOR) 25 MG tablet Take 0.5 tablets (12.5 mg total) by mouth 2 (two) times daily. 30 tablet 3  . warfarin (COUMADIN) 7.5 MG tablet Take as directed by coumadin clinic    . acetaminophen (TYLENOL) 500 MG tablet Take 2 tablets (1,000 mg total) by mouth every 6 (six) hours as needed. (Patient not taking: Reported on 08/12/2016) 30 tablet 0   No current facility-administered medications for this visit.       Physical Exam:   BP 114/74 (BP Location: Right Arm,  Patient Position: Sitting, Cuff Size: Large)   Pulse 86   Resp 16   Ht 5\' 9"  (1.753 m)   Wt 174 lb (78.9 kg)   SpO2 93% Comment: ON RA  BMI 25.70 kg/m   General:  Well-appearing  Chest:   Clear to auscultation  CV:   Regular rate and rhythm without murmur  Incisions:  Healing nicely  Abdomen:  Soft nontender  Extremities:  Warm and well-perfused  Diagnostic Tests:  CHEST  2 VIEW  COMPARISON:  07/31/2016  FINDINGS: Changes of mitral valve repair. No pneumothorax. Biapical scarring. Heart is borderline in size. No effusions.  IMPRESSION: Post mitral valve repair.  No acute findings.   Electronically Signed   By: Charlett Nose M.D.   On: 08/15/2016 09:39   Impression:  Patient is doing well less than one month status post minimally invasive mitral valve repair   Plan:  I have encouraged the patient to continue to gradually increase his physical activity as tolerated with his primary limitations remaining that he refrain from heavy lifting or strenuous use of his arms or shoulders over the next several weeks. We discussed a plan for monitoring his exercise regimen including the target heart rate to keep his heart rate less than 130 bpm during exercise over the next month or 2. We have not recommended any changes to his current medications. We would plan to keep him on Coumadin for a total of 3 months from the time of  surgery at which time it can be stopped if he is not having any irregular heart rhythms.  The patient has been reminded regarding the importance of dental hygiene and the lifelong need for antibiotic prophylaxis for all dental cleanings and other related invasive procedures.  He will return to our office for routine follow-up in approximately 2 months or sooner should specific problems or questions arise.     Salvatore Decent. [name], MD 08/15/2016 10:14 AM

## 2016-08-16 ENCOUNTER — Ambulatory Visit (INDEPENDENT_AMBULATORY_CARE_PROVIDER_SITE_OTHER): Payer: Medicare Other

## 2016-08-16 DIAGNOSIS — Z9889 Other specified postprocedural states: Secondary | ICD-10-CM | POA: Diagnosis not present

## 2016-08-16 DIAGNOSIS — Z5181 Encounter for therapeutic drug level monitoring: Secondary | ICD-10-CM | POA: Diagnosis not present

## 2016-08-16 LAB — POCT INR: INR: 4.5

## 2016-08-23 ENCOUNTER — Ambulatory Visit (INDEPENDENT_AMBULATORY_CARE_PROVIDER_SITE_OTHER): Payer: Medicare Other | Admitting: *Deleted

## 2016-08-23 DIAGNOSIS — Z5181 Encounter for therapeutic drug level monitoring: Secondary | ICD-10-CM | POA: Diagnosis not present

## 2016-08-23 DIAGNOSIS — Z9889 Other specified postprocedural states: Secondary | ICD-10-CM | POA: Diagnosis not present

## 2016-08-23 LAB — POCT INR: INR: 3

## 2016-08-30 ENCOUNTER — Ambulatory Visit (INDEPENDENT_AMBULATORY_CARE_PROVIDER_SITE_OTHER): Payer: Medicare Other | Admitting: *Deleted

## 2016-08-30 DIAGNOSIS — Z9889 Other specified postprocedural states: Secondary | ICD-10-CM

## 2016-08-30 DIAGNOSIS — Z5181 Encounter for therapeutic drug level monitoring: Secondary | ICD-10-CM | POA: Diagnosis not present

## 2016-08-30 LAB — POCT INR: INR: 2.5

## 2016-09-05 ENCOUNTER — Ambulatory Visit (INDEPENDENT_AMBULATORY_CARE_PROVIDER_SITE_OTHER): Payer: Medicare Other | Admitting: Pharmacist

## 2016-09-05 ENCOUNTER — Ambulatory Visit (HOSPITAL_COMMUNITY): Payer: Medicare Other | Attending: Physician Assistant

## 2016-09-05 ENCOUNTER — Other Ambulatory Visit: Payer: Self-pay

## 2016-09-05 DIAGNOSIS — I517 Cardiomegaly: Secondary | ICD-10-CM | POA: Diagnosis not present

## 2016-09-05 DIAGNOSIS — Z9889 Other specified postprocedural states: Secondary | ICD-10-CM | POA: Diagnosis not present

## 2016-09-05 DIAGNOSIS — Z5181 Encounter for therapeutic drug level monitoring: Secondary | ICD-10-CM | POA: Diagnosis not present

## 2016-09-05 DIAGNOSIS — I313 Pericardial effusion (noninflammatory): Secondary | ICD-10-CM | POA: Insufficient documentation

## 2016-09-05 LAB — POCT INR: INR: 3

## 2016-09-07 ENCOUNTER — Encounter: Payer: Self-pay | Admitting: Physician Assistant

## 2016-09-07 ENCOUNTER — Telehealth: Payer: Self-pay | Admitting: *Deleted

## 2016-09-07 NOTE — Telephone Encounter (Signed)
-----   Message from Beatrice Lecher, [place] sent at 09/07/2016  2:18 PM EDT ----- Please call the patient. The ejection fraction is s/w lower than prior to his MV surgery.  This will likely improve over time. I will have Dr. Tonny Bollman review his echo as well. Continue with current management and follow up as planned.  Please fax a copy of this study result to his PCP:  Patient, No Pcp Per  Thanks! Tereso Newcomer, PA-C    09/07/2016 2:14 PM

## 2016-09-07 NOTE — Telephone Encounter (Signed)
Lmtcb to go over echo results.  

## 2016-09-08 ENCOUNTER — Telehealth: Payer: Self-pay | Admitting: Cardiovascular Disease

## 2016-09-08 NOTE — Telephone Encounter (Signed)
See phone note from 09/07/16.

## 2016-09-08 NOTE — Telephone Encounter (Signed)
Dr. Zachery Dakins is  Returning a call to get his test results .Marland Kitchen Please call

## 2016-09-08 NOTE — Telephone Encounter (Signed)
Went over results with Pt. Pt verbalized understanding and wanted a hard copy of echocardiogram results to give to his other cardiologist, Dr. Joseph Art in [place], [place] He stated he will pick up in office tomorrow on 09/09/2016. Pt has no PCP.

## 2016-09-14 ENCOUNTER — Telehealth: Payer: Self-pay | Admitting: Physician Assistant

## 2016-09-14 DIAGNOSIS — I340 Nonrheumatic mitral (valve) insufficiency: Secondary | ICD-10-CM

## 2016-09-14 NOTE — Telephone Encounter (Signed)
Please tell Dr. Zachery Dakins I reviewed his echo with Dr. Tonny Bollman. We would like him to start an ACE inhibitor or angiotensin receptor blocker since his ejection fraction is somewhat lower. Start Lisinopril 5 mg QD. Arrange a BMET in 1-2 weeks. Schedule a follow up echocardiogram in 6 months.  Keep follow up appointment as planned.  Tereso Newcomer, PA-C    09/14/2016 5:36 PM

## 2016-09-15 ENCOUNTER — Telehealth: Payer: Self-pay

## 2016-09-15 NOTE — Telephone Encounter (Signed)
Mr Jeremy Fisher will be having his PT/INR levels checked at the [facility] with Cardiologist Dr Lucretia Roers. His therapeutic levels are between 2.0-3.0.  Mr Jeremy Fisher  And Dr Lucretia Roers was notified.

## 2016-09-16 NOTE — Telephone Encounter (Signed)
I called pt and Lmtcb to d/w pt recommendations per Bing Neighbors. PA and Dr. Excell Seltzer. Recommendations to start Lisinopril 5 mg daily, with BMET to be done in 1-2 weeks and an echo to be done in 6 months. Pt is currently scheduled to see Dr. Excell Seltzer 11/28/16.

## 2016-09-19 ENCOUNTER — Encounter: Payer: Self-pay | Admitting: Physician Assistant

## 2016-09-19 MED ORDER — LISINOPRIL 5 MG PO TABS: 5.0000 mg | ORAL_TABLET | Freq: Every day | ORAL | 3 refills | Status: DC

## 2016-09-19 NOTE — Telephone Encounter (Signed)
I reached pt on his cell phone today. Pt states to me he is away in [place], [place] for the summer. I s/w pt in regards to recommendations per Dr. Excell Seltzer and Tereso Newcomer, [facility] as to starting Lisinopril 5 mg tablet with directions to take 1 tablet daily. That the Lisinopril will help with the heart squeeze (EF) to become stronger. Pt asked if I could send it to a [facility] in [place], [place]. Pt did not which [facility] and asked if I could look this [facility].  [id number] 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 Excell Seltzer. Pt advised he will also need to be set up for an echo to be in 6 months to recheck the EF.  Pt did state he really did not want to start another medication unless we knew what it was for. I explained to the pt what I had said earlier was this will help with the heart squeeze. Pt is agreeable to start medication therapy.

## 2016-11-16 ENCOUNTER — Telehealth: Payer: Self-pay

## 2016-11-16 NOTE — Telephone Encounter (Signed)
Patient notified

## 2016-11-16 NOTE — Telephone Encounter (Signed)
-----   Message from Purcell Nails, MD sent at 11/16/2016  6:06 AM EDT ----- Regarding: RE: coumadin He may stop Coumadin and start ASA 81 mg/day  ----- Message ----- From: Joycelyn Schmid, LPN Sent: 09/18/735   4:47 PM To: Purcell Nails, MD Subject: coumadin                                       Patient calling about stopping coumadin... [facility].Marland Kitchen He mentioned that you said 3 months post-op. Before stopping. He is scheduled to see Dr Excell Seltzer 11/28/16.  Please advise SW

## 2016-11-24 NOTE — Addendum Note (Signed)
Addendum  created 11/24/16 1332 by Kipp Brood, MD   Sign clinical note

## 2016-11-28 ENCOUNTER — Ambulatory Visit (INDEPENDENT_AMBULATORY_CARE_PROVIDER_SITE_OTHER): Payer: Medicare Other | Admitting: Cardiovascular Disease

## 2016-11-28 ENCOUNTER — Encounter: Payer: Self-pay | Admitting: Cardiovascular Disease

## 2016-11-28 VITALS — BP 130/84 | HR 89 | Ht 69.0 in | Wt 180.0 lb

## 2016-11-28 DIAGNOSIS — I340 Nonrheumatic mitral (valve) insufficiency: Secondary | ICD-10-CM

## 2016-11-28 DIAGNOSIS — Z9889 Other specified postprocedural states: Secondary | ICD-10-CM | POA: Diagnosis not present

## 2016-11-28 MED ORDER — AMOXICILLIN 500 MG PO CAPS: ORAL_CAPSULE | ORAL | 0 refills | Status: DC

## 2016-11-28 MED ORDER — METOPROLOL SUCCINATE ER 25 MG PO TB24: 25.0000 mg | ORAL_TABLET | Freq: Every day | ORAL | 3 refills | Status: DC

## 2016-11-28 NOTE — Progress Notes (Signed)
Cardiology Office Note Date:  11/28/2016   ID:  Jeremy Fisher, DOB February 04, 1941, MRN [id number]  PCP:  Patient, No Pcp Per  Cardiologist:  Tonny Bollman, MD    Chief Complaint  Patient presents with  . Follow-up    mitral valve disorder   History of Present Illness: Jeremy Fisher is a 76 y.o. male who presents for follow-up of mitral regurgitation. The patient was noted to have mitral valve prolapse with a large flail segment of the posterior leaflet associated with severe mitral regurgitation. He underwent minimally invasive mitral valve repair in April 2018 by Dr. [name]. He was treated with warfarin for a total of 3 months and now is back on aspirin 81 mg daily after completing the 3 months of warfarin.  The patient is here alone today. He's feeling very well. He notes no residual effects from his surgery. He specifically denies chest pain, chest pressure, or shortness of breath with physical activity. He denies edema or heart palpitations. He feels well and seems to be tolerating his medicines well. He forgets to take his evening dose of metoprolol at times.  Past Medical History:  Diagnosis Date  . Mitral valve prolapse   . Patent foramen ovale 07/20/2016  . Pulmonary embolism (HCC) 2007   was on Coumadin but has been off for 5 yrs   . S/P patent foramen ovale closure 07/27/2016  . Severe mitral regurgitation    s/p MV repair // Echo 6/18: Abnormal septal motion, diffuse HK post MV repair, mild LVH, EF 45-50, MV repair with no residual MR and normal diastolic gradients, trivial pericardial effusion  . Severe mitral regurgitation 06/27/2016    Past Surgical History:  Procedure Laterality Date  . CHOLECYSTECTOMY N/A 06/17/2013   Procedure: LAPAROSCOPIC CHOLECYSTECTOMY WITH INTRAOPERATIVE CHOLANGIOGRAM;  Surgeon: Mariella Saa, MD;  Location: [facility];  Service: General;  Laterality: N/A;  . ERCP N/A 05/22/2014   Procedure: ENDOSCOPIC RETROGRADE CHOLANGIOPANCREATOGRAPHY  (ERCP);  Surgeon: Louis Meckel, MD;  Location: [facility] ENDOSCOPY;  Service: Endoscopy;  Laterality: N/A;  . EYE SURGERY     right eye  . HERNIA REPAIR Left   . MITRAL VALVE REPLACEMENT Right 07/27/2016   Procedure: MINIMALLY INVASIVE MITRAL VALVE (MV) REPAIR;  Surgeon: Purcell Nails, MD;  Location: [facility] OR;  Service: Open Heart Surgery;  Laterality: Right;  . REPAIR OF PATENT FORAMEN OVALE N/A 07/27/2016   Procedure: REPAIR OF PATENT FORAMEN OVALE;  Surgeon: Purcell Nails, MD;  Location: [facility] OR;  Service: Open Heart Surgery;  Laterality: N/A;  . RIGHT/LEFT HEART CATH AND CORONARY ANGIOGRAPHY N/A 07/20/2016   Procedure: Right/Left Heart Cath and Coronary Angiography;  Surgeon: Tonny Bollman, MD;  Location: [facility] INVASIVE CV LAB;  Service: Cardiovascular;  Laterality: N/A;  . SPYGLASS CHOLANGIOSCOPY N/A 05/22/2014   Procedure: NWGNFAOZ CHOLANGIOSCOPY;  Surgeon: Louis Meckel, MD;  Location: [facility] ENDOSCOPY;  Service: Endoscopy;  Laterality: N/A;  . TEE WITHOUT CARDIOVERSION N/A 07/20/2016   Procedure: TRANSESOPHAGEAL ECHOCARDIOGRAM (TEE);  Surgeon: Laurey Morale, MD;  Location: [facility] ENDOSCOPY;  Service: Cardiovascular;  Laterality: N/A;  . TEE WITHOUT CARDIOVERSION N/A 07/27/2016   Procedure: TRANSESOPHAGEAL ECHOCARDIOGRAM (TEE);  Surgeon: Purcell Nails, MD;  Location: [facility] OR;  Service: Open Heart Surgery;  Laterality: N/A;    Current Outpatient Prescriptions  Medication Sig Dispense Refill  . aspirin EC 81 MG EC tablet Take 1 tablet (81 mg total) by mouth daily.    Marland Kitchen lisinopril (PRINIVIL,ZESTRIL) 5 MG tablet Take 1  tablet (5 mg total) by mouth daily. 90 tablet 3  . amoxicillin (AMOXIL) 500 MG capsule Take 2 grams (4 caps) 1 hour prior to dental procedures 4 capsule 0  . metoprolol succinate (TOPROL-XL) 25 MG 24 hr tablet Take 1 tablet (25 mg total) by mouth daily. Take with or immediately following a meal. 90 tablet 3   No current facility-administered medications for this visit.     Allergies:    Primaxin [imipenem]   Social History:  The patient  reports that he has never smoked. He has never used smokeless tobacco. He reports that he drinks alcohol. He reports that he does not use drugs.   Family History:  The patient's family history includes Heart disease in his father; Pulmonary fibrosis in his father.    ROS:  Please see the history of present illness.   All other systems are reviewed and negative.    PHYSICAL EXAM: VS:  BP 130/84   Pulse 89   Ht 5\' 9"  (1.753 m)   Wt 180 lb (81.6 kg)   SpO2 96%   BMI 26.58 kg/m  , BMI Body mass index is 26.58 kg/m. GEN: Well nourished, well developed, in no acute distress  HEENT: normal  Neck: no JVD, no masses. No carotid bruits Cardiac: RRR without murmur or gallop                Respiratory:  clear to auscultation bilaterally, normal work of breathing GI: soft, nontender, nondistended, + BS MS: no deformity or atrophy  Ext: no pretibial edema, pedal pulses 2+= bilaterally Skin: warm and dry, no rash Neuro:  Strength and sensation are intact Psych: euthymic mood, full affect  EKG:  EKG is not ordered today.  Recent Labs: 07/25/2016: ALT 17 07/28/2016: Magnesium 2.1 07/30/2016: BUN 14; Creatinine, Ser 0.87; Potassium 3.4; Sodium 138 07/31/2016: Hemoglobin 13.8; Platelets 153   Lipid Panel  No results found for: CHOL, TRIG, HDL, CHOLHDL, VLDL, LDLCALC, LDLDIRECT    Wt Readings from Last 3 Encounters:  11/28/16 180 lb (81.6 kg)  08/15/16 174 lb (78.9 kg)  08/12/16 174 lb (78.9 kg)     Cardiac Studies Reviewed: 2D Echo 09/05/2016: Study Conclusions  - Left ventricle: Abnormal septal motion diffuse hypokinesis post   MV repair. The cavity size was normal. Wall thickness was   increased in a pattern of mild LVH. Systolic function was mildly   reduced. The estimated ejection fraction was in the range of 45%   to 50%. The study is not technically sufficient to allow   evaluation of LV diastolic function. - Mitral valve:  Post repari with ring and no residual MR normal   diastolic gradients. - Atrial septum: No defect or patent foramen ovale was identified. - Pericardium, extracardiac: A trivial pericardial effusion was   identified.  Cardiac Cath 07/20/2016: Conclusion   1. Patent coronary arteries with minimal nonobstructive CAD, intramyocardial segment of mid-LAD noted 2. Normal LVEDP and PA pressure 3. Large V waves c/w severe mitral regurgitation  Recommend: Continued evaluation for mitral valve repair per Dr [name]    ASSESSMENT AND PLAN: 1.  Mitral valve disorder status post minimally invasive mitral valve repair: The patient's exam is normal and he is completely asymptomatic. We discussed the need for SBE prophylaxis and he understands to take amoxicillin 2 g one hour prior to dental work.  2. Nonischemic cardiomyopathy, mild: The patient has mild LV systolic dysfunction following mitral valve repair. He is tolerating a low dose of lisinopril  and metoprolol. Will change him to metoprolol succinate 25 mg daily. I've recommended an echocardiogram prior to his return visit here in 6 months.  3. Coronary artery disease, minor nonobstructive: I reviewed Dr. Annette Stable angiogram with him today. His right coronary artery and left circumflex are essentially normal. There is mild plaquing in the mid LAD but I think the narrowed segment is primarily related to an intramyocardial segment. We discussed the pros and cons of a statin drug and he prefers not to take one. I think this is reasonable.  Current medicines are reviewed with the patient today.  The patient does not have concerns regarding medicines.  Labs/ tests ordered today include:   Orders Placed This Encounter  Procedures  . Basic metabolic panel  . ECHOCARDIOGRAM COMPLETE    Disposition:   FU 6 months with an echo prior to the office visit.   Enzo Bi, MD  11/28/2016 5:28 PM    [facility] [address] [place], [place], [place]  [address] Phone: [phone]; Fax: [phone]

## 2016-11-28 NOTE — Patient Instructions (Addendum)
Medication Instructions:  Your physician has recommended you make the following change in your medication:   STOP metoprolol tartrate   START metoprolol succinate 25 mg daily  Labwork: Your physician recommends that you return for lab work TOMORROW for [employer]   Testing/Procedures: Your physician has requested that you have an echocardiogram in 6 months prior to appointment with Dr. Excell Seltzer. Echocardiography is a painless test that uses sound waves to create images of your heart. It provides your doctor with information about the size and shape of your heart and how well your heart's chambers and valves are working. This procedure takes approximately one hour. There are no restrictions for this procedure.   Follow-Up: Your physician wants you to follow-up in: 6 months with Dr. Excell Seltzer. You will receive a reminder letter in the mail two months in advance. If you don't receive a letter, please call our office to schedule the follow-up appointment.   Any Other Special Instructions Will Be Listed Below (If Applicable).     If you need a refill on your cardiac medications before your next appointment, please call your pharmacy.

## 2016-11-29 ENCOUNTER — Other Ambulatory Visit: Payer: Medicare Other

## 2016-11-29 DIAGNOSIS — I340 Nonrheumatic mitral (valve) insufficiency: Secondary | ICD-10-CM | POA: Diagnosis not present

## 2016-11-29 NOTE — Addendum Note (Signed)
Addended by: Marylou Flesher on: 11/29/2016 01:33 PM   Modules accepted: Orders

## 2016-11-30 LAB — BASIC METABOLIC PANEL
BUN / CREAT RATIO: 18 (ref 10–24)
BUN: 20 mg/dL (ref 8–27)
CO2: 18 mmol/L — ABNORMAL LOW (ref 20–29)
CREATININE: 1.11 mg/dL (ref 0.76–1.27)
Calcium: 10.5 mg/dL — ABNORMAL HIGH (ref 8.6–10.2)
Chloride: 105 mmol/L (ref 96–106)
GFR calc non Af Amer: 65 mL/min/{1.73_m2} (ref >59)
GFR, EST AFRICAN AMERICAN: 75 mL/min/{1.73_m2} (ref >59)
Glucose: 67 mg/dL (ref 65–99)
Potassium: 4.1 mmol/L (ref 3.5–5.2)
Sodium: 142 mmol/L (ref 134–144)

## 2017-05-02 DIAGNOSIS — H5000 Unspecified esotropia: Secondary | ICD-10-CM | POA: Diagnosis not present

## 2017-05-02 DIAGNOSIS — H25813 Combined forms of age-related cataract, bilateral: Secondary | ICD-10-CM | POA: Diagnosis not present

## 2017-05-22 ENCOUNTER — Other Ambulatory Visit: Payer: Self-pay

## 2017-05-22 ENCOUNTER — Ambulatory Visit (HOSPITAL_COMMUNITY): Payer: Medicare Other | Attending: Cardiovascular Disease

## 2017-05-22 DIAGNOSIS — I428 Other cardiomyopathies: Secondary | ICD-10-CM | POA: Diagnosis not present

## 2017-05-22 DIAGNOSIS — I2510 Atherosclerotic heart disease of native coronary artery without angina pectoris: Secondary | ICD-10-CM | POA: Insufficient documentation

## 2017-05-22 DIAGNOSIS — I340 Nonrheumatic mitral (valve) insufficiency: Secondary | ICD-10-CM | POA: Insufficient documentation

## 2017-05-25 DIAGNOSIS — H2512 Age-related nuclear cataract, left eye: Secondary | ICD-10-CM | POA: Diagnosis not present

## 2017-05-25 DIAGNOSIS — H25812 Combined forms of age-related cataract, left eye: Secondary | ICD-10-CM | POA: Diagnosis not present

## 2017-06-11 NOTE — Progress Notes (Signed)
Cardiology Office Note Date:  06/12/2017   ID:  Jeremy Fisher, DOB 06/04/1940, MRN [id number]  PCP:  Patient, No Pcp Per  Cardiologist:  Tonny Bollman, MD    Chief Complaint  Patient presents with  . Follow-up    Mitral Valve Disease     History of Present Illness: Jeremy Fisher is a 77 y.o. male who presents for follow-up evaluation.  The patient underwent minimally invasive mitral valve repair and PFO closure July 27, 2016 for treatment of severe symptomatic primary mitral regurgitation secondary to mitral valve prolapse.  He had an uncomplicated hospital course.  The patient is here alone today.  He has been feeling well.  Notes that his blood pressure increases with physical activity and sometimes right after physical work his blood pressure will be in the 150s or 160s.  His resting blood pressures have been in a reasonable range, this morning 135/75.  He denies chest pain or pressure.  He has mild shortness of breath with physical activity such as carrying heavy objects.  All of his normal activities cause no symptoms at all.  He denies edema, heart palpitations, orthopnea, PND, lightheadedness, or syncope.  He has his labs followed through the [place] system and plans to have them done again in June of this year.  Past Medical History:  Diagnosis Date  . Mitral valve prolapse   . Patent foramen ovale 07/20/2016  . Pulmonary embolism (HCC) 2007   was on Coumadin but has been off for 5 yrs   . S/P patent foramen ovale closure 07/27/2016  . Severe mitral regurgitation    s/p MV repair // Echo 6/18: Abnormal septal motion, diffuse HK post MV repair, mild LVH, EF 45-50, MV repair with no residual MR and normal diastolic gradients, trivial pericardial effusion  . Severe mitral regurgitation 06/27/2016    Past Surgical History:  Procedure Laterality Date  . CHOLECYSTECTOMY N/A 06/17/2013   Procedure: LAPAROSCOPIC CHOLECYSTECTOMY WITH INTRAOPERATIVE CHOLANGIOGRAM;  Surgeon:  Mariella Saa, MD;  Location: [facility] ORS;  Service: General;  Laterality: N/A;  . ERCP N/A 05/22/2014   Procedure: ENDOSCOPIC RETROGRADE CHOLANGIOPANCREATOGRAPHY (ERCP);  Surgeon: Louis Meckel, MD;  Location: [facility] ENDOSCOPY;  Service: Endoscopy;  Laterality: N/A;  . EYE SURGERY     right eye  . HERNIA REPAIR Left   . MITRAL VALVE REPLACEMENT Right 07/27/2016   Procedure: MINIMALLY INVASIVE MITRAL VALVE (MV) REPAIR;  Surgeon: Purcell Nails, MD;  Location: [facility] OR;  Service: Open Heart Surgery;  Laterality: Right;  . REPAIR OF PATENT FORAMEN OVALE N/A 07/27/2016   Procedure: REPAIR OF PATENT FORAMEN OVALE;  Surgeon: Purcell Nails, MD;  Location: [facility] OR;  Service: Open Heart Surgery;  Laterality: N/A;  . RIGHT/LEFT HEART CATH AND CORONARY ANGIOGRAPHY N/A 07/20/2016   Procedure: Right/Left Heart Cath and Coronary Angiography;  Surgeon: Tonny Bollman, MD;  Location: [facility] INVASIVE CV LAB;  Service: Cardiovascular;  Laterality: N/A;  . SPYGLASS CHOLANGIOSCOPY N/A 05/22/2014   Procedure: [id number] CHOLANGIOSCOPY;  Surgeon: Louis Meckel, MD;  Location: [facility] ENDOSCOPY;  Service: Endoscopy;  Laterality: N/A;  . TEE WITHOUT CARDIOVERSION N/A 07/20/2016   Procedure: TRANSESOPHAGEAL ECHOCARDIOGRAM (TEE);  Surgeon: Laurey Morale, MD;  Location: [facility] ENDOSCOPY;  Service: Cardiovascular;  Laterality: N/A;  . TEE WITHOUT CARDIOVERSION N/A 07/27/2016   Procedure: TRANSESOPHAGEAL ECHOCARDIOGRAM (TEE);  Surgeon: Purcell Nails, MD;  Location: [facility] OR;  Service: Open Heart Surgery;  Laterality: N/A;    Current Outpatient Medications  Medication Sig  Dispense Refill  . amoxicillin (AMOXIL) 500 MG capsule Take 2 grams (4 caps) 1 hour prior to dental procedures 4 capsule 0  . aspirin EC 81 MG EC tablet Take 1 tablet (81 mg total) by mouth daily.    Marland Kitchen lisinopril (PRINIVIL,ZESTRIL) 10 MG tablet Take 1 tablet (10 mg total) by mouth daily. 30 tablet 11  . metoprolol succinate (TOPROL-XL) 25 MG 24 hr tablet Take 25 mg by mouth  daily.  3   No current facility-administered medications for this visit.     Allergies:   Primaxin [imipenem]   Social History:  The patient  reports that  has never smoked. he has never used smokeless tobacco. He reports that he drinks alcohol. He reports that he does not use drugs.   Family History:  The patient's  family history includes Heart disease in his father; Pulmonary fibrosis in his father.    ROS:  Please see the history of present illness.  All other systems are reviewed and negative.    PHYSICAL EXAM: VS:  BP 140/80   Pulse 79   Ht 5\' 8"  (1.727 m)   Wt 175 lb (79.4 kg)   BMI 26.61 kg/m  , BMI Body mass index is 26.61 kg/m. GEN: Well nourished, well developed, in no acute distress  HEENT: normal  Neck: no JVD, no masses. No carotid bruits Cardiac: RRR without murmur or gallop                Respiratory:  clear to auscultation bilaterally, normal work of breathing GI: soft, nontender, nondistended, + BS MS: no deformity or atrophy  Ext: no pretibial edema, pedal pulses 2+= bilaterally Skin: warm and dry, no rash Neuro:  Strength and sensation are intact Psych: euthymic mood, full affect  EKG:  EKG is ordered today. The ekg ordered today shows normal sinus rhythm with first-degree AV block, heart rate 79 bpm.  Recent Labs: 07/25/2016: ALT 17 07/28/2016: Magnesium 2.1 07/31/2016: Hemoglobin 13.8; Platelets 153 11/29/2016: BUN 20; Creatinine, Ser 1.11; Potassium 4.1; Sodium 142   Lipid Panel  No results found for: CHOL, TRIG, HDL, CHOLHDL, VLDL, LDLCALC, LDLDIRECT    Wt Readings from Last 3 Encounters:  06/12/17 175 lb (79.4 kg)  11/28/16 180 lb (81.6 kg)  08/15/16 174 lb (78.9 kg)     Cardiac Studies Reviewed: Echo 05-22-2017: Left ventricle:  The cavity size was normal. Systolic function was mildly reduced. The estimated ejection fraction was in the range of 45% to 50%. Wall motion was normal; there were no regional wall motion abnormalities. The  study is not technically sufficient to allow evaluation of LV diastolic function.  ------------------------------------------------------------------- Aortic valve:   Trileaflet; normal thickness leaflets. Mobility was not restricted.  Doppler:  Transvalvular velocity was within the normal range. There was no stenosis. There was no regurgitation.   ------------------------------------------------------------------- Aorta:  Aortic root: The aortic root was normal in size.  ------------------------------------------------------------------- Mitral valve:   Mildly thickened leaflets . Prior procedures included surgical repair. An annuloplasty ring and hyperechoic subvalvular structures (neo-chordae?) are present. Mobility was not restricted.  Doppler:  Transvalvular velocity was minimally increased. There was no regurgitation.    Peak gradient (D): 8 mm Hg.  ------------------------------------------------------------------- Left atrium:  The atrium was at the upper limits of normal in size.   ------------------------------------------------------------------- Right ventricle:  The cavity size was normal. Wall thickness was normal. Systolic function was normal.  ------------------------------------------------------------------- Pulmonic valve:    Structurally normal valve.   Cusp separation was normal.  Doppler:  Transvalvular velocity was within the normal range. There was no evidence for stenosis. There was trivial regurgitation.  ------------------------------------------------------------------- Tricuspid valve:   Structurally normal valve.    Doppler: Transvalvular velocity was within the normal range. There was mild regurgitation.  ------------------------------------------------------------------- Pulmonary artery:   The main pulmonary artery was normal-sized. Systolic pressure was within the normal  range.  ------------------------------------------------------------------- Right atrium:  The atrium was normal in size.  ------------------------------------------------------------------- Pericardium:  There was no pericardial effusion.  ------------------------------------------------------------------- Systemic veins: Inferior vena cava: The vessel was normal in size.  ASSESSMENT AND PLAN: 1.  Mitral valve disease status post mitral valve repair: The patient's recent echocardiogram is reviewed and demonstrates normal function of his mitral valve without significant stenosis or regurgitation.  SBE prophylaxis is reviewed.  2.  Nonischemic cardiomyopathy: The patient's LV function is stable by most recent echo with an ejection fraction of 45-50%.  Will increase lisinopril to 10 mg daily.  He will continue metoprolol succinate.  3.  Hypertension: Reviewed blood pressure data with the patient.  Recommend increase lisinopril to 10 mg, continue metoprolol succinate 25 mg daily.  I will plan to see the patient back in 1 year for follow-up.  His labs are followed through the [place] [facility].   Current medicines are reviewed with the patient today.  The patient does not have concerns regarding medicines.  Labs/ tests ordered today include:   Orders Placed This Encounter  Procedures  . EKG 12-Lead    Disposition:   FU one year  Signed, Tonny Bollman, MD  06/12/2017 10:00 AM    [facility] [address] [place], [place], [place]  [address] Phone: [phone]; Fax: [phone]

## 2017-06-12 ENCOUNTER — Ambulatory Visit (INDEPENDENT_AMBULATORY_CARE_PROVIDER_SITE_OTHER): Payer: Medicare Other | Admitting: Cardiovascular Disease

## 2017-06-12 ENCOUNTER — Encounter: Payer: Self-pay | Admitting: Cardiovascular Disease

## 2017-06-12 VITALS — BP 140/80 | HR 79 | Ht 68.0 in | Wt 175.0 lb

## 2017-06-12 DIAGNOSIS — I428 Other cardiomyopathies: Secondary | ICD-10-CM

## 2017-06-12 DIAGNOSIS — I2510 Atherosclerotic heart disease of native coronary artery without angina pectoris: Secondary | ICD-10-CM | POA: Diagnosis not present

## 2017-06-12 DIAGNOSIS — I340 Nonrheumatic mitral (valve) insufficiency: Secondary | ICD-10-CM

## 2017-06-12 MED ORDER — LISINOPRIL 10 MG PO TABS: 10.0000 mg | ORAL_TABLET | Freq: Every day | ORAL | 11 refills | Status: DC

## 2017-06-12 NOTE — Patient Instructions (Signed)
Medication Instructions:  1) INCREASE LISINOPRIL to 10 mg daily  Labwork: None  Testing/Procedures: None  Follow-Up: Your provider wants you to follow-up in: 1 year with Dr. Cooper. You will receive a reminder letter in the mail two months in advance. If you don't receive a letter, please call our office to schedule the follow-up appointment.    Any Other Special Instructions Will Be Listed Below (If Applicable).     If you need a refill on your cardiac medications before your next appointment, please call your pharmacy.   

## 2017-06-15 DIAGNOSIS — H25811 Combined forms of age-related cataract, right eye: Secondary | ICD-10-CM | POA: Diagnosis not present

## 2017-06-15 DIAGNOSIS — H2511 Age-related nuclear cataract, right eye: Secondary | ICD-10-CM | POA: Diagnosis not present

## 2017-07-15 IMAGING — CR DG CHEST 1V PORT
1 series · 1 of 1 positions shown · non-contrast
Comparison: 07/27/2016.

CLINICAL DATA: Chest tube.  Mitral valve repair.

EXAM:
PORTABLE CHEST 1 VIEW

[AP]
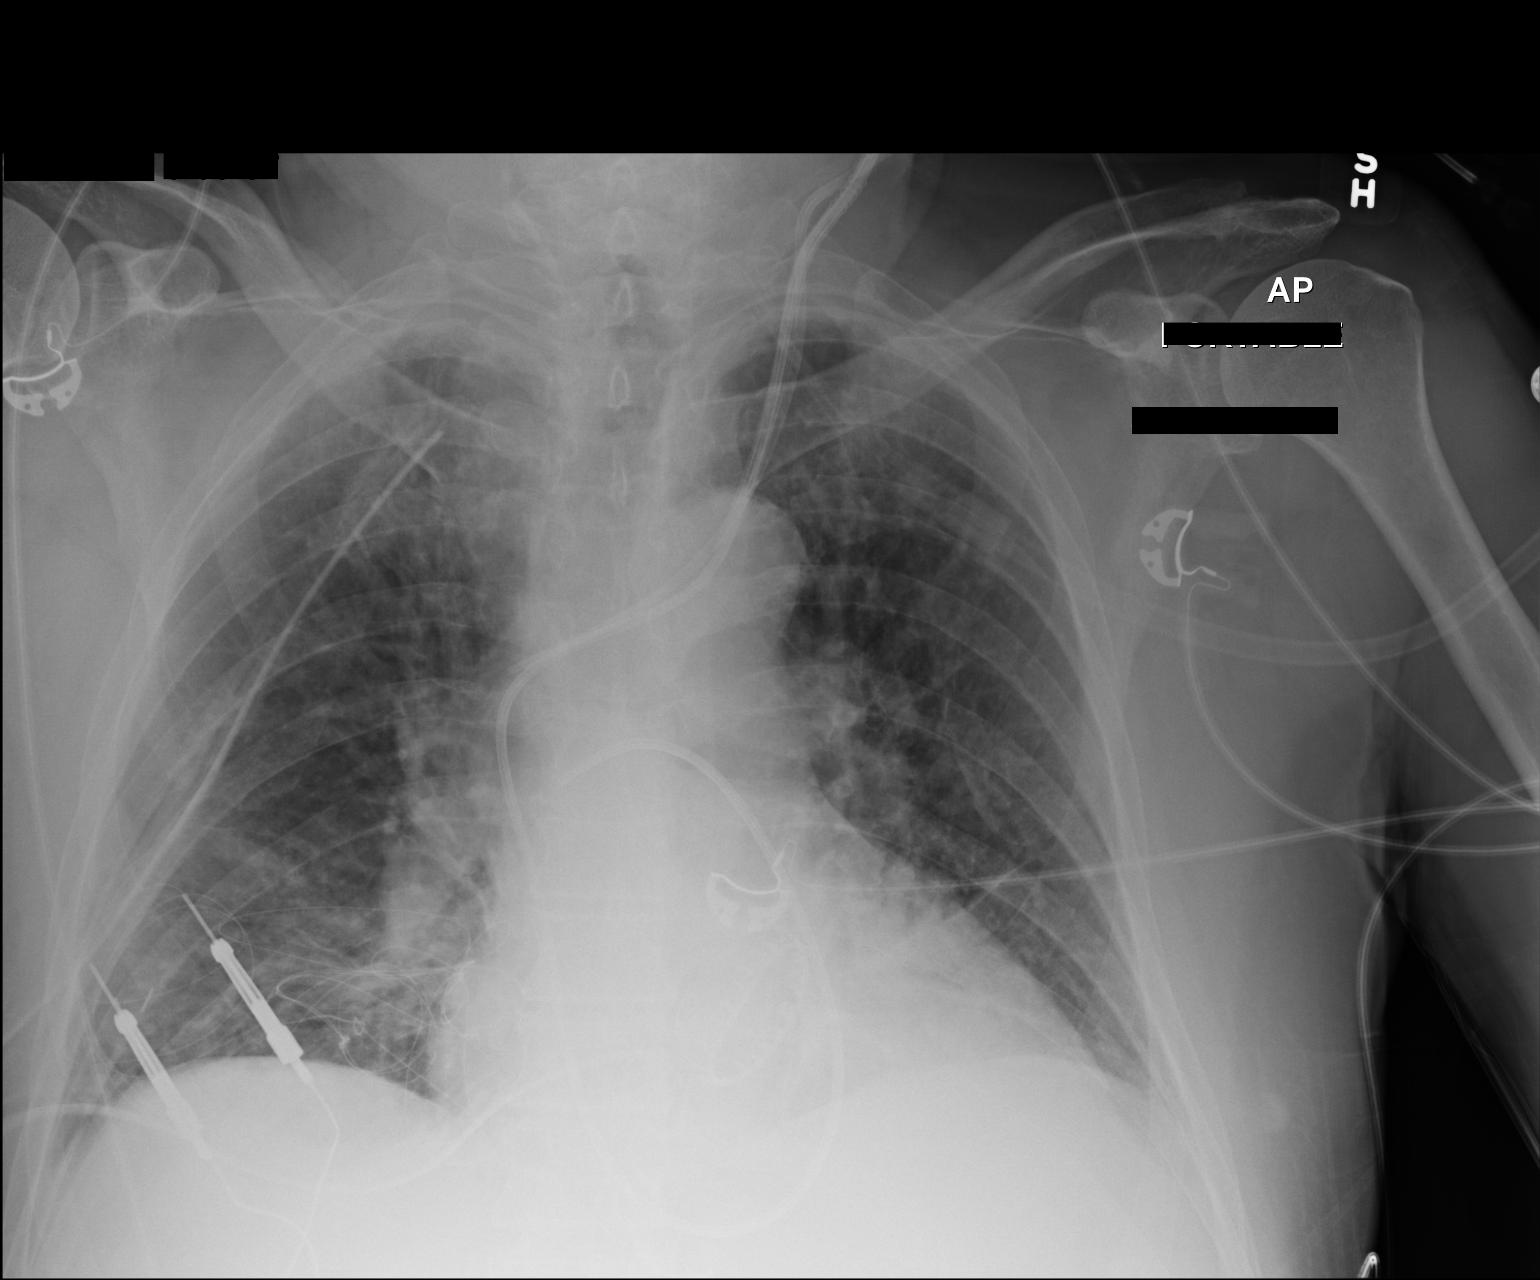

[1 of 1 positions shown; findings below may reference images not displayed]

FINDINGS: Left IJ line, Swan-Ganz catheter in stable position. Right chest
tubes in stable position. Prior mitral valve replacement.
Cardiomegaly with pulmonary vascular prominence. Low lung volumes .
No pneumothorax.
IMPRESSION: 1. Lines and tubes including right chest tubes in stable position.
No pneumothorax.

2. Prior mitral valve replacement. Cardiomegaly with pulmonary
venous congestion.

3. Low lung volumes with basilar atelectasis

## 2017-07-16 IMAGING — CR DG CHEST 1V PORT
1 series · 1 of 1 positions shown · non-contrast
Comparison: July 28, 2016

CLINICAL DATA: Chest tube present. Status post mitral valve
replacement

EXAM:
PORTABLE CHEST 1 VIEW

[AP]
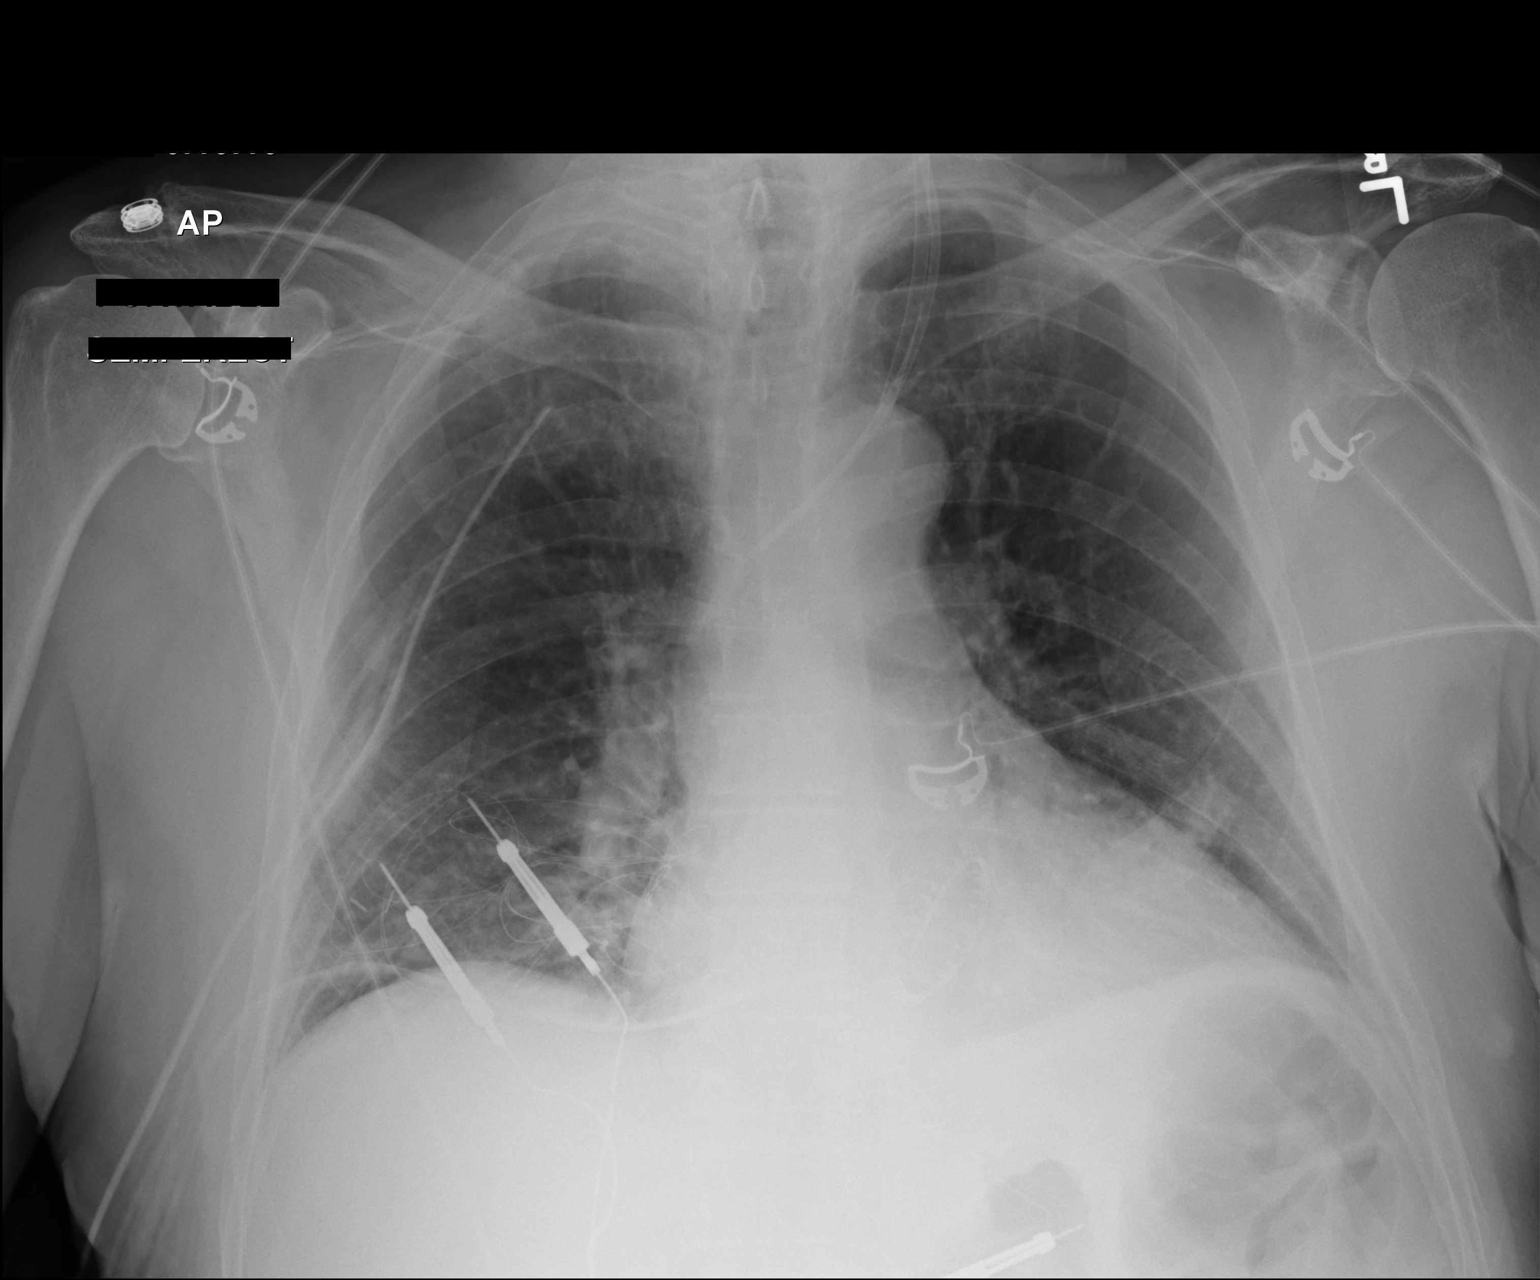

[1 of 1 positions shown; findings below may reference images not displayed]

FINDINGS: Chest tube noted on the right. Central catheter tips in left
innominate vein with removal of Swan-Ganz catheter. Temporary
pacemaker leads are attached to the right heart. No pneumothorax.
There is atelectatic change in the left base. Lungs elsewhere are
clear. Heart is borderline prominent with pulmonary vascularity
within normal limits. Patient is status post mitral valve
replacement. No adenopathy. No bone lesions.
IMPRESSION: Tube and catheter positions as described without evident
pneumothorax. Left base atelectasis. Lungs elsewhere clear. Stable
cardiac prominence.

## 2017-08-02 IMAGING — CR DG CHEST 2V
2 series · 2 of 2 positions shown · non-contrast
Comparison: 07/31/2016

CLINICAL DATA: Post mitral valve repair

EXAM:
CHEST  2 VIEW

[w chest pa]
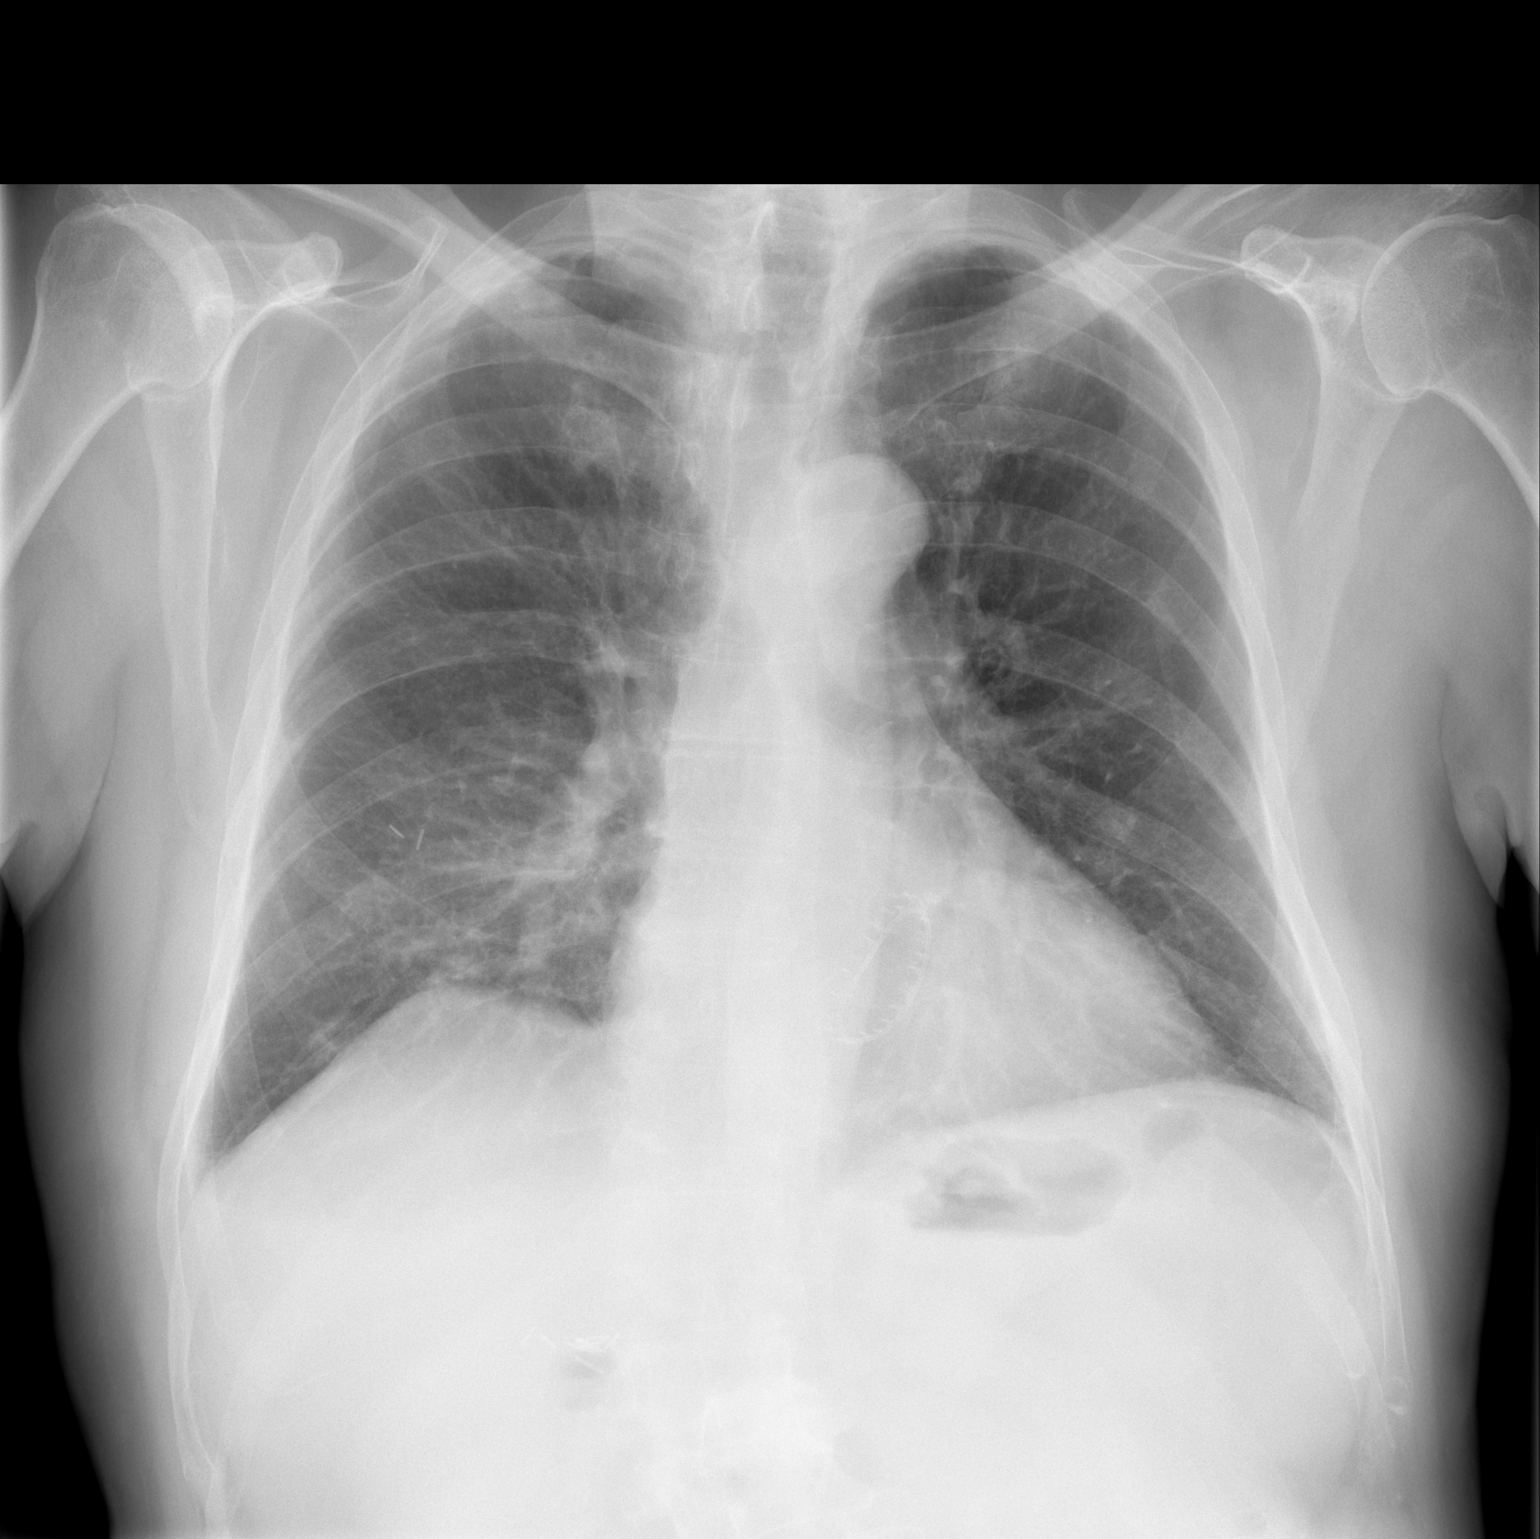

[w chest lat]
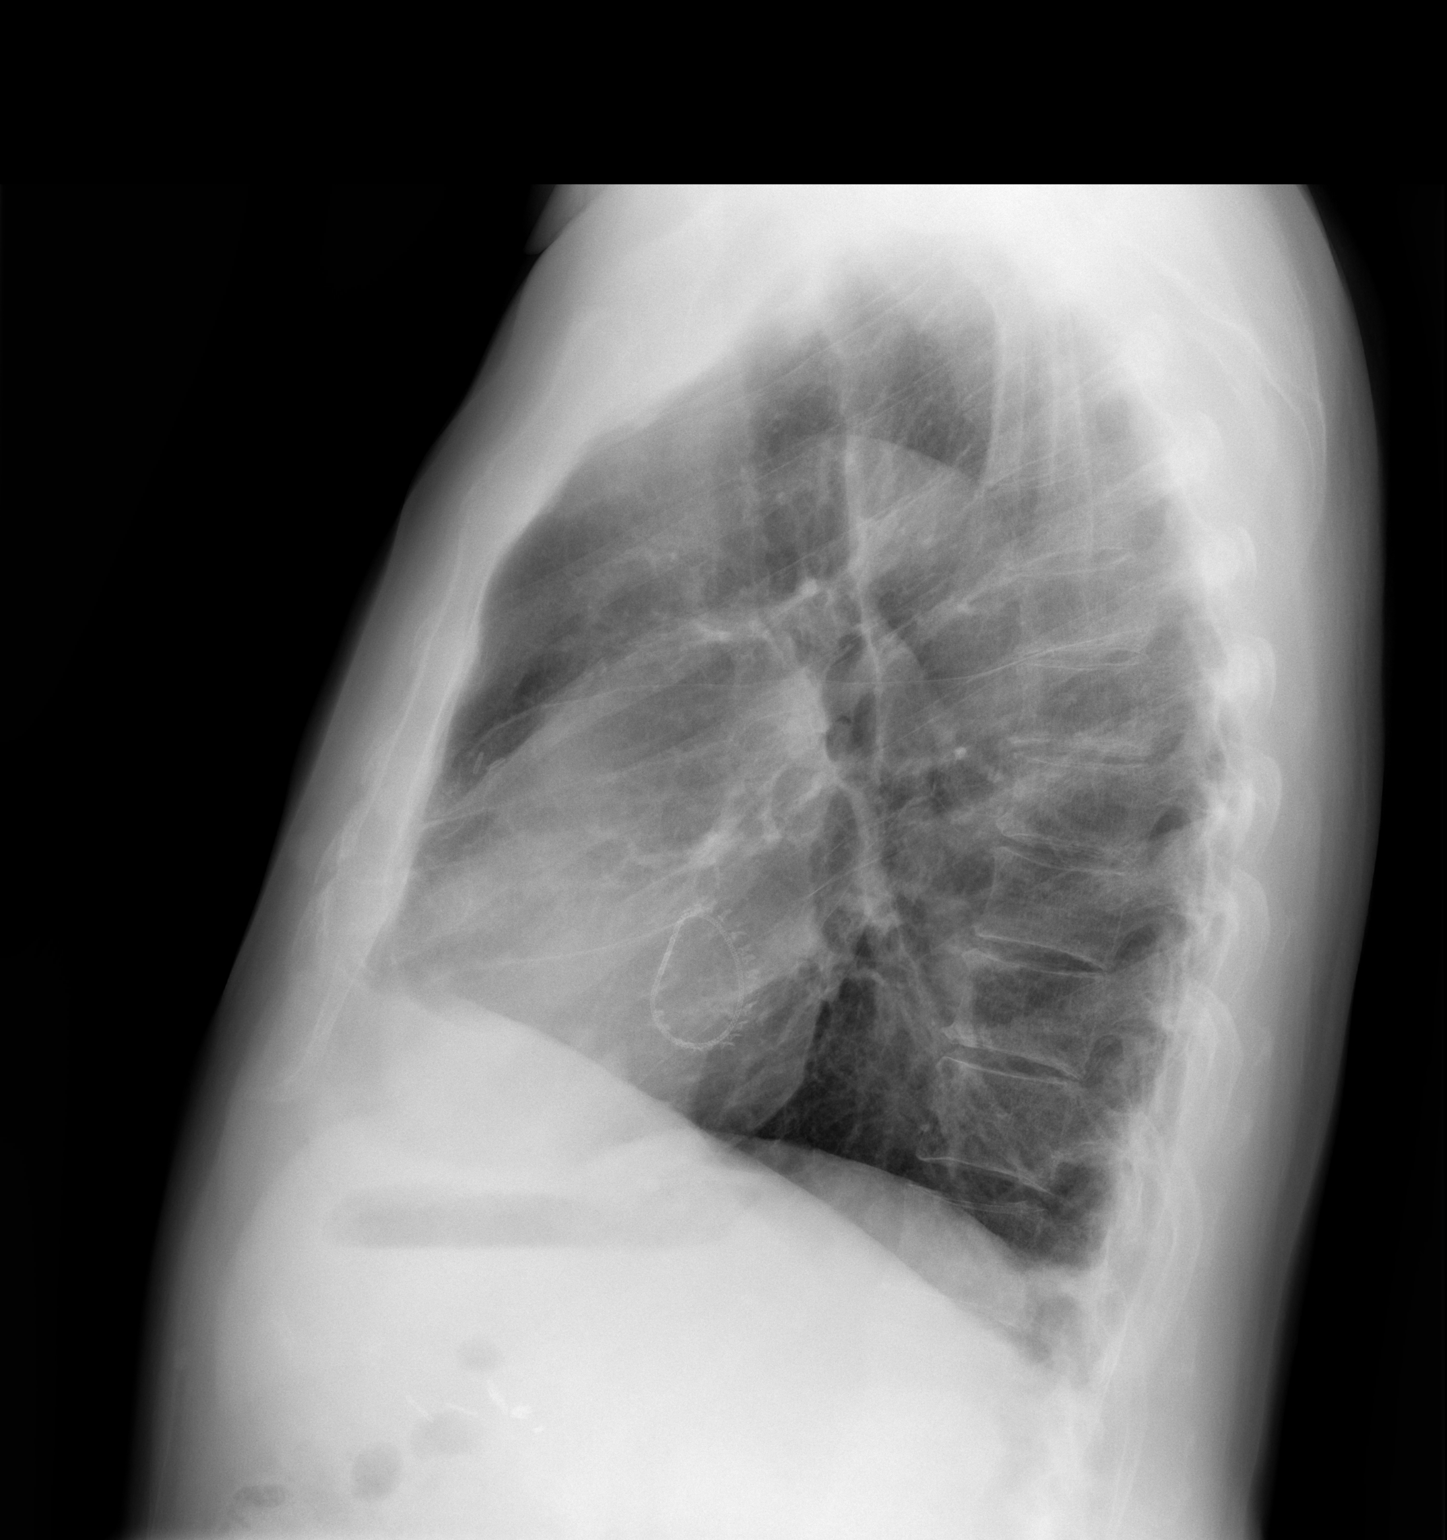

[2 of 2 positions shown; findings below may reference images not displayed]

FINDINGS: Changes of mitral valve repair. No pneumothorax. Biapical scarring.
Heart is borderline in size. No effusions.
IMPRESSION: Post mitral valve repair.  No acute findings.

## 2018-06-25 ENCOUNTER — Other Ambulatory Visit: Payer: Self-pay

## 2018-06-25 MED ORDER — LISINOPRIL 10 MG PO TABS: 10.0000 mg | ORAL_TABLET | Freq: Every day | ORAL | 1 refills | Status: DC

## 2018-09-20 ENCOUNTER — Telehealth: Payer: Self-pay | Admitting: Cardiovascular Disease

## 2018-09-20 NOTE — Telephone Encounter (Signed)
Left message for patient that I did not call him, and to call back if I can assist him in any way.

## 2018-09-20 NOTE — Telephone Encounter (Signed)
New Message ° ° ° °Pt is returning a call  ° ° °Please call back  °

## 2018-10-24 ENCOUNTER — Encounter: Payer: Self-pay | Admitting: Cardiovascular Disease

## 2018-10-24 ENCOUNTER — Other Ambulatory Visit: Payer: Self-pay

## 2018-10-24 ENCOUNTER — Ambulatory Visit (INDEPENDENT_AMBULATORY_CARE_PROVIDER_SITE_OTHER): Payer: Medicare Other | Admitting: Cardiovascular Disease

## 2018-10-24 VITALS — BP 110/78 | HR 80 | Ht 68.0 in | Wt 173.6 lb

## 2018-10-24 DIAGNOSIS — I059 Rheumatic mitral valve disease, unspecified: Secondary | ICD-10-CM | POA: Diagnosis not present

## 2018-10-24 DIAGNOSIS — I428 Other cardiomyopathies: Secondary | ICD-10-CM | POA: Diagnosis not present

## 2018-10-24 NOTE — Progress Notes (Signed)
Cardiology Office Note:    Date:  10/24/2018   ID:  Jeremy CoachWilliam J Fisher, DOB 08/10/1940, MRN [id number]  PCP:  Patient, No Pcp Per  Cardiologist:  Tonny BollmanMichael Lacrisha Bielicki, MD  Electrophysiologist:  None   Referring MD: No ref. provider found   Chief Complaint  Patient presents with  . Mitral Valve Prolapse    History of Present Illness:    Jeremy Fisher is a 78 y.o. male with a hx of mitral valve regurgitation status post minimally invasive mitral valve repair and PFO closure in 2018.  The patient had mitral valve prolapse and developed severe symptomatic primary mitral regurgitation leading to his cardiac surgery.  He had an uncomplicated post operative course.  He has been noted to have mild LV systolic dysfunction since his cardiac surgery with an LVEF in the range of 45 to 50% and he is treated with a combination of lisinopril and metoprolol succinate.  Dr. Zachery DakinsWeatherly is here alone today.  He is doing very well and staying quite active with work around his house.  He has no exertional symptoms and specifically denies chest pain, chest pressure, shortness of breath, heart palpitations, lightheadedness, or leg swelling.  He has no orthopnea or PND.  He follows SBE prophylaxis when indicated.  He reports no changes in his medications.  He continues to have regular labs followed through the [facility].  Past Medical History:  Diagnosis Date  . Mitral valve prolapse   . Patent foramen ovale 07/20/2016  . Pulmonary embolism (HCC) 2007   was on Coumadin but has been off for 5 yrs   . S/P patent foramen ovale closure 07/27/2016  . Severe mitral regurgitation    s/p MV repair // Echo 6/18: Abnormal septal motion, diffuse HK post MV repair, mild LVH, EF 45-50, MV repair with no residual MR and normal diastolic gradients, trivial pericardial effusion  . Severe mitral regurgitation 06/27/2016    Past Surgical History:  Procedure Laterality Date  . CHOLECYSTECTOMY N/A 06/17/2013   Procedure: LAPAROSCOPIC CHOLECYSTECTOMY WITH INTRAOPERATIVE CHOLANGIOGRAM;  Surgeon: Mariella SaaBenjamin T Hoxworth, MD;  Location: [facility]S;  Service: General;  Laterality: N/A;  . ERCP N/A 05/22/2014   Procedure: ENDOSCOPIC RETROGRADE CHOLANGIOPANCREATOGRAPHY (ERCP);  Surgeon: Louis Meckelobert D Kaplan, MD;  [facility];  Service: Endoscopy;  Laterality: N/A;  . EYE SURGERY     right eye  . HERNIA REPAIR Left   . MITRAL VALVE REPLACEMENT Right 07/27/2016   Procedure: MINIMALLY INVASIVE MITRAL VALVE (MV) REPAIR;  Surgeon: Jeremy Nailslarence H Owen, MD;  Location: [facility] OR;  Service: Open Heart Surgery;  Laterality: Right;  . REPAIR OF PATENT FORAMEN OVALE N/A 07/27/2016   Procedure: REPAIR OF PATENT FORAMEN OVALE;  Surgeon: Jeremy Nailslarence H Owen, MD;  Location: [facility] OR;  Service: Open Heart Surgery;  Laterality: N/A;  . RIGHT/LEFT HEART CATH AND CORONARY ANGIOGRAPHY N/A 07/20/2016   Procedure: Right/Left Heart Cath and Coronary Angiography;  Surgeon: Tonny BollmanMichael Kseniya Grunden, MD;  [facility];  Service: Cardiovascular;  Laterality: N/A;  . SPYGLASS CHOLANGIOSCOPY N/A 05/22/2014   Procedure: AOZHYQMVSPYGLASS CHOLANGIOSCOPY;  Surgeon: Louis Meckelobert D Kaplan, MD;  [facility];  Service: Endoscopy;  Laterality: N/A;  . TEE WITHOUT CARDIOVERSION N/A 07/20/2016   Procedure: TRANSESOPHAGEAL ECHOCARDIOGRAM (TEE);  Surgeon: Laurey Moralealton S McLean, MD;  [facility];  Service: Cardiovascular;  Laterality: N/A;  . TEE WITHOUT CARDIOVERSION N/A 07/27/2016   Procedure: TRANSESOPHAGEAL ECHOCARDIOGRAM (TEE);  Surgeon: Jeremy Nailslarence H Owen, MD;  [facility];  Service: Open Heart  Surgery;  Laterality: N/A;    Current Medications: Current Meds  Medication Sig  . amoxicillin (AMOXIL) 500 MG capsule Take 2 grams (4 caps) 1 hour prior to dental procedures  . aspirin EC 81 MG EC tablet Take 1 tablet (81 mg total) by mouth daily.  Marland Kitchen lisinopril (PRINIVIL,ZESTRIL) 10 MG tablet Take 1 tablet (10 mg total) by mouth daily.  . metoprolol succinate  (TOPROL-XL) 25 MG 24 hr tablet Take 25 mg by mouth daily.     Allergies:   Primaxin [imipenem]   Social History   Socioeconomic History  . Marital status: Married    Spouse name: Not on file  . Number of children: Not on file  . Years of education: Not on file  . Highest education level: Not on file  Occupational History  . Not on file  Social Needs  . Financial resource strain: Not on file  . Food insecurity    Worry: Not on file    Inability: Not on file  . Transportation needs    Medical: Not on file    Non-medical: Not on file  Tobacco Use  . Smoking status: Never Smoker  . Smokeless tobacco: Never Used  Substance and Sexual Activity  . Alcohol use: Yes    Comment: rarely  . Drug use: No  . Sexual activity: Never  Lifestyle  . Physical activity    Days per week: Not on file    Minutes per session: Not on file  . Stress: Not on file  Relationships  . Social Herbalist on phone: Not on file    Gets together: Not on file    Attends religious service: Not on file    Active member of club or organization: Not on file    Attends meetings of clubs or organizations: Not on file    Relationship status: Not on file  Other Topics Concern  . Not on file  Social History Narrative  . Not on file     Family History: The patient's family history includes Heart disease in his father; Pulmonary fibrosis in his father.  ROS:   Please see the history of present illness.    All other systems reviewed and are negative.  EKGs/Labs/Other Studies Reviewed:    The following studies were reviewed today: 2D echocardiogram 05/22/2017: Study Conclusions  - Left ventricle: The cavity size was normal. Systolic function was   mildly reduced. The estimated ejection fraction was in the range   of 45% to 50%. Wall motion was normal; there were no regional   wall motion abnormalities. The study is not technically   sufficient to allow evaluation of LV diastolic function.  - Mitral valve: Prior procedures included surgical repair. An   annuloplasty ring and hyperechoic subvalvular structures   (neo-chordae?) are present.  EKG:  EKG is ordered today.  The ekg ordered today demonstrates normal sinus rhythm 80 bpm, occasional fusion beat, otherwise normal.  Recent Labs: No results found for requested labs within last 8760 hours.  Recent Lipid Panel No results found for: CHOL, TRIG, HDL, CHOLHDL, VLDL, LDLCALC, LDLDIRECT  Physical Exam:    VS:  BP 110/78   Pulse 80   Ht 5\' 8"  (1.727 m)   Wt 173 lb 9.6 oz (78.7 kg)   SpO2 92%   BMI 26.40 kg/m     Wt Readings from Last 3 Encounters:  10/24/18 173 lb 9.6 oz (78.7 kg)  06/12/17 175 lb (79.4 kg)  11/28/16  180 lb (81.6 kg)     GEN:  Well nourished, well developed in no acute distress HEENT: Normal NECK: No JVD; No carotid bruits LYMPHATICS: No lymphadenopathy CARDIAC: RRR, no murmurs, rubs, gallops RESPIRATORY:  Clear to auscultation without rales, wheezing or rhonchi  ABDOMEN: Soft, non-tender, non-distended MUSCULOSKELETAL:  No edema; No deformity  SKIN: Warm and dry NEUROLOGIC:  Alert and oriented x 3 PSYCHIATRIC:  Normal affect   ASSESSMENT:    1. Mitral valve disease   2. NICM (nonischemic cardiomyopathy) (HCC)    PLAN:    In order of problems listed above:  1. The patient's exam is completely normal with no murmur whatsoever.  His last echocardiogram is reviewed and demonstrated normal mitral valve function after mitral valve repair.  He will continue with SBE prophylaxis per guidelines.  He will remain on aspirin 81 mg daily.  We will repeat an echocardiogram when I see him back in the spring 2021. 2. The patient has New York Heart Association functional class I symptoms.  He remains on metoprolol succinate and lisinopril.  Last assessment of LVEF in 2019 showed an LVEF of 45 to 50%.  Will update at the time of his return visit next spring. 3.    Medication Adjustments/Labs and Tests  Ordered: Current medicines are reviewed at length with the patient today.  Concerns regarding medicines are outlined above.  Orders Placed This Encounter  Procedures  . EKG 12-Lead  . ECHOCARDIOGRAM COMPLETE   No orders of the defined types were placed in this encounter.   Patient Instructions  Medication Instructions:  Your provider recommends that you continue on your current medications as directed. Please refer to the Current Medication list given to you today.    Labwork: None  Testing/Procedures: Your provider has requested that you have an echocardiogram in the Spring, 2021. Echocardiography is a painless test that uses sound waves to create images of your heart. It provides your doctor with information about the size and shape of your heart and how well your heart's chambers and valves are working. This procedure takes approximately one hour. There are no restrictions for this procedure.  Follow-Up: You will be called to arrange your echo and office visit with Dr. Excell Seltzer in the Spring when his schedule is available.     Signed, Tonny Bollman, MD  10/24/2018 4:08 PM    [facility]

## 2018-10-24 NOTE — Patient Instructions (Signed)
Medication Instructions:  Your provider recommends that you continue on your current medications as directed. Please refer to the Current Medication list given to you today.    Labwork: None  Testing/Procedures: Your provider has requested that you have an echocardiogram in the Spring, 2021. Echocardiography is a painless test that uses sound waves to create images of your heart. It provides your doctor with information about the size and shape of your heart and how well your heart's chambers and valves are working. This procedure takes approximately one hour. There are no restrictions for this procedure.  Follow-Up: You will be called to arrange your echo and office visit with Dr. Burt Knack in the Spring when his schedule is available.

## 2018-10-25 MED ORDER — LISINOPRIL 10 MG PO TABS: 10.0000 mg | ORAL_TABLET | Freq: Every day | ORAL | 3 refills | Status: DC

## 2018-10-25 MED ORDER — METOPROLOL SUCCINATE ER 25 MG PO TB24: 25.0000 mg | ORAL_TABLET | Freq: Every day | ORAL | 3 refills | Status: AC

## 2018-10-25 NOTE — Addendum Note (Signed)
Addended by: Harland German A on: 10/25/2018 08:09 AM   Modules accepted: Orders

## 2018-10-26 DIAGNOSIS — H5000 Unspecified esotropia: Secondary | ICD-10-CM | POA: Diagnosis not present

## 2018-10-26 DIAGNOSIS — H52203 Unspecified astigmatism, bilateral: Secondary | ICD-10-CM | POA: Diagnosis not present

## 2018-10-26 DIAGNOSIS — Z961 Presence of intraocular lens: Secondary | ICD-10-CM | POA: Diagnosis not present

## 2019-04-18 ENCOUNTER — Ambulatory Visit: Payer: Medicare Other | Attending: Internal Medicine

## 2019-04-18 DIAGNOSIS — Z23 Encounter for immunization: Secondary | ICD-10-CM | POA: Insufficient documentation

## 2019-04-18 NOTE — Progress Notes (Signed)
   Covid-19 Vaccination Clinic  Name:  Jeremy Fisher    MRN: [id number] DOB: 27-Aug-1940  04/18/2019  [name] was observed post Covid-19 immunization for 15 minutes without incidence. He was provided with Vaccine Information Sheet and instruction to access the V-Safe system.   [name] was instructed to call 911 with any severe reactions post vaccine: Marland Kitchen Difficulty breathing  . Swelling of your face and throat  . A fast heartbeat  . A bad rash all over your body  . Dizziness and weakness    Immunizations Administered    Name Date Dose VIS Date Route   Pfizer COVID-19 Vaccine 04/18/2019  9:06 AM 0.3 mL 03/15/2019 Intramuscular   Manufacturer: [employer], [employer]   Lot: [id number]   NDC: [address]-8

## 2019-05-06 ENCOUNTER — Ambulatory Visit: Payer: Medicare Other

## 2019-05-09 ENCOUNTER — Ambulatory Visit: Payer: Medicare Other | Attending: Internal Medicine

## 2019-05-09 DIAGNOSIS — Z23 Encounter for immunization: Secondary | ICD-10-CM | POA: Insufficient documentation

## 2019-05-09 NOTE — Progress Notes (Signed)
   Covid-19 Vaccination Clinic  Name:  Jeremy Fisher    MRN: [id number]  DOB: April 25, 1940  05/09/2019  [name] was observed post Covid-19 immunization for 15 minutes without incidence. He was provided with Vaccine Information Sheet and instruction to access the V-Safe system.   [name] was instructed to call 911 with any severe reactions post vaccine: Marland Kitchen Difficulty breathing  . Swelling of your face and throat  . A fast heartbeat  . A bad rash all over your body  . Dizziness and weakness    Immunizations Administered    Name Date Dose VIS Date Route   Pfizer COVID-19 Vaccine 05/09/2019  9:15 AM 0.3 mL 03/15/2019 Intramuscular   Manufacturer: [employer], [employer]   Lot: [id number]   NDC: [address]-7

## 2019-07-22 ENCOUNTER — Ambulatory Visit (INDEPENDENT_AMBULATORY_CARE_PROVIDER_SITE_OTHER): Payer: Medicare Other | Admitting: Cardiovascular Disease

## 2019-07-22 ENCOUNTER — Encounter: Payer: Self-pay | Admitting: Cardiovascular Disease

## 2019-07-22 ENCOUNTER — Other Ambulatory Visit: Payer: Self-pay

## 2019-07-22 ENCOUNTER — Ambulatory Visit (HOSPITAL_COMMUNITY): Payer: Medicare Other | Attending: Cardiovascular Disease

## 2019-07-22 VITALS — BP 126/74 | HR 62 | Ht 68.0 in | Wt 176.2 lb

## 2019-07-22 DIAGNOSIS — I059 Rheumatic mitral valve disease, unspecified: Secondary | ICD-10-CM

## 2019-07-22 NOTE — Progress Notes (Signed)
Cardiology Office Note:    Date:  07/22/2019   ID:  Jeremy Fisher, DOB 1940/06/05, MRN [id number]  PCP:  Patient, No Pcp Per  Cardiologist:  Tonny Bollman, MD  Electrophysiologist:  None   Referring MD: No ref. provider found   Chief Complaint  Patient presents with  . Cough    History of Present Illness:    Jeremy Fisher is a 79 y.o. male with a hx of mitral valve regurgitation status post minimally invasive mitral valve repair and PFO closure in 2018.  The patient had mitral valve prolapse and developed severe symptomatic primary mitral regurgitation leading to his cardiac surgery.  He had an uncomplicated post operative course.   The patient is here alone today.  He is doing very well.  He brings in blood work dated November 15, 2018 demonstrating a PSA of 0.75, AST 19, ALT 22, alkaline phosphatase 82, hemoglobin 16.1, platelet count 206,000, serum creatinine 1.2 mg/dL, potassium 4.8, calcium 9.9.  TSH is 1.85.  Today, he denies symptoms of palpitations, chest pain, shortness of breath, orthopnea, PND, lower extremity edema, dizziness, or syncope.  He does have a cough and wonders if this is related to lisinopril.  Otherwise no specific complaints.  He is physically active with no exertional symptoms.  Past Medical History:  Diagnosis Date  . Mitral valve prolapse   . Patent foramen ovale 07/20/2016  . Pulmonary embolism (HCC) 2007   was on Coumadin but has been off for 5 yrs   . S/P patent foramen ovale closure 07/27/2016  . Severe mitral regurgitation    s/p MV repair // Echo 6/18: Abnormal septal motion, diffuse HK post MV repair, mild LVH, EF 45-50, MV repair with no residual MR and normal diastolic gradients, trivial pericardial effusion  . Severe mitral regurgitation 06/27/2016    Past Surgical History:  Procedure Laterality Date  . CHOLECYSTECTOMY N/A 06/17/2013   Procedure: LAPAROSCOPIC CHOLECYSTECTOMY WITH INTRAOPERATIVE CHOLANGIOGRAM;  Surgeon: Mariella Saa, MD;  Location: [facility]S;  Service: General;  Laterality: N/A;  . ERCP N/A 05/22/2014   Procedure: ENDOSCOPIC RETROGRADE CHOLANGIOPANCREATOGRAPHY (ERCP);  Surgeon: Louis Meckel, MD;  Location: [facility] ENDOSCOPY;  Service: Endoscopy;  Laterality: N/A;  . EYE SURGERY     right eye  . HERNIA REPAIR Left   . MITRAL VALVE REPLACEMENT Right 07/27/2016   Procedure: MINIMALLY INVASIVE MITRAL VALVE (MV) REPAIR;  Surgeon: Purcell Nails, MD;  Location: [facility] OR;  Service: Open Heart Surgery;  Laterality: Right;  . REPAIR OF PATENT FORAMEN OVALE N/A 07/27/2016   Procedure: REPAIR OF PATENT FORAMEN OVALE;  Surgeon: Purcell Nails, MD;  Location: [facility] OR;  Service: Open Heart Surgery;  Laterality: N/A;  . RIGHT/LEFT HEART CATH AND CORONARY ANGIOGRAPHY N/A 07/20/2016   Procedure: Right/Left Heart Cath and Coronary Angiography;  Surgeon: Tonny Bollman, MD;  Location: [facility] INVASIVE CV LAB;  Service: Cardiovascular;  Laterality: N/A;  . SPYGLASS CHOLANGIOSCOPY N/A 05/22/2014   Procedure: [id number] CHOLANGIOSCOPY;  Surgeon: Louis Meckel, MD;  Location: [facility] ENDOSCOPY;  Service: Endoscopy;  Laterality: N/A;  . TEE WITHOUT CARDIOVERSION N/A 07/20/2016   Procedure: TRANSESOPHAGEAL ECHOCARDIOGRAM (TEE);  Surgeon: Laurey Morale, MD;  Location: [facility] ENDOSCOPY;  Service: Cardiovascular;  Laterality: N/A;  . TEE WITHOUT CARDIOVERSION N/A 07/27/2016   Procedure: TRANSESOPHAGEAL ECHOCARDIOGRAM (TEE);  Surgeon: Purcell Nails, MD;  Location: [facility] OR;  Service: Open Heart Surgery;  Laterality: N/A;    Current Medications: Current Meds  Medication Sig  .  amoxicillin (AMOXIL) 500 MG capsule Take 2 grams (4 caps) 1 hour prior to dental procedures  . aspirin EC 81 MG EC tablet Take 1 tablet (81 mg total) by mouth daily.  Marland Kitchen lisinopril (ZESTRIL) 10 MG tablet Take 1 tablet (10 mg total) by mouth daily.  . metoprolol succinate (TOPROL-XL) 25 MG 24 hr tablet Take 1 tablet (25 mg total) by mouth daily.     Allergies:   Primaxin  [imipenem]   Social History   Socioeconomic History  . Marital status: Married    Spouse name: Not on file  . Number of children: Not on file  . Years of education: Not on file  . Highest education level: Not on file  Occupational History  . Not on file  Tobacco Use  . Smoking status: Never Smoker  . Smokeless tobacco: Never Used  Substance and Sexual Activity  . Alcohol use: Yes    Comment: rarely  . Drug use: No  . Sexual activity: Never  Other Topics Concern  . Not on file  Social History Narrative  . Not on file   Social Determinants of Health   Financial Resource Strain:   . Difficulty of Paying Living Expenses:   Food Insecurity:   . Worried About Programme researcher, broadcasting/film/video in the Last Year:   . Barista in the Last Year:   Transportation Needs:   . Freight forwarder (Medical):   Marland Kitchen Lack of Transportation (Non-Medical):   Physical Activity:   . Days of Exercise per Week:   . Minutes of Exercise per Session:   Stress:   . Feeling of Stress :   Social Connections:   . Frequency of Communication with Friends and Family:   . Frequency of Social Gatherings with Friends and Family:   . Attends Religious Services:   . Active Member of Clubs or Organizations:   . Attends Banker Meetings:   Marland Kitchen Marital Status:      Family History: The patient's family history includes Heart disease in his father; Pulmonary fibrosis in his father.  ROS:   Please see the history of present illness.    All other systems reviewed and are negative.  EKGs/Labs/Other Studies Reviewed:    The following studies were reviewed today: Echo (today): IMPRESSIONS    1. Normal GLS -16.1. Left ventricular ejection fraction, by estimation,  is 50 to 55%. The left ventricle has low normal function. The left  ventricle has no regional wall motion abnormalities. Left ventricular  diastolic parameters are indeterminate.  2. Right ventricular systolic function is normal. The  right ventricular  size is normal.  3. Left atrial size was mildly dilated.  4. Post repair/closure PFO no residual seen.  5. Post repair with reasonable diastolic gradient and no significant  residual MR. The mitral valve has been repaired/replaced. No evidence of  mitral valve regurgitation. No evidence of mitral stenosis. There is a  repaired present in the mitral position.  Procedure Date: 07/27/16.  6. The aortic valve is tricuspid. Aortic valve regurgitation is not  visualized. No aortic stenosis is present.  7. The inferior vena cava is normal in size with greater than 50%  respiratory variability, suggesting right atrial pressure of 3 mmHg.   Comparison(s): 05/22/17 EF 45-50%.   FINDINGS  Left Ventricle: Normal GLS -16.1. Left ventricular ejection fraction, by  estimation, is 50 to 55%. The left ventricle has low normal function. The  left ventricle has no regional wall  motion abnormalities. The left  ventricular internal cavity size was  normal in size. There is no left ventricular hypertrophy. Left ventricular  diastolic parameters are indeterminate.   Right Ventricle: The right ventricular size is normal. No increase in  right ventricular wall thickness. Right ventricular systolic function is  normal.   Left Atrium: Left atrial size was mildly dilated.   Right Atrium: Right atrial size was normal in size.   Pericardium: There is no evidence of pericardial effusion.   Mitral Valve: Post repair with reasonable diastolic gradient and no  significant residual MR. The mitral valve has been repaired/replaced.  Normal mobility of the mitral valve leaflets. No evidence of mitral valve  regurgitation. There is a repaired present  in the mitral position. Procedure Date: 07/27/16. No evidence of mitral  valve stenosis. MV peak gradient, 7.3 mmHg. The mean mitral valve gradient  is 3.0 mmHg.   Tricuspid Valve: The tricuspid valve is normal in structure. Tricuspid  valve  regurgitation is mild . No evidence of tricuspid stenosis.   Aortic Valve: The aortic valve is tricuspid. Aortic valve regurgitation is  not visualized. No aortic stenosis is present.   Pulmonic Valve: The pulmonic valve was normal in structure. Pulmonic valve  regurgitation is not visualized. No evidence of pulmonic stenosis.   Aorta: The aortic root is normal in size and structure.   Venous: The inferior vena cava is normal in size with greater than 50%  respiratory variability, suggesting right atrial pressure of 3 mmHg.   IAS/Shunts: No atrial level shunt detected by color flow Doppler.     LEFT VENTRICLE  PLAX 2D  LVIDd:     4.90 cm Diastology  LVIDs:     3.40 cm LV e' lateral:  3.60 cm/s  LV PW:     0.90 cm LV E/e' lateral: 28.9  LV IVS:    1.10 cm LV e' medial:  3.38 cm/s  LVOT diam:   2.40 cm LV E/e' medial: 30.8  LV SV:     89  LV SV Index:  46    2D Longitudinal Strain  LVOT Area:   4.52 cm 2D Strain GLS (A2C):  -16.3 %             2D Strain GLS (A3C):  -15.8 %             2D Strain GLS (A4C):  -16.2 %             2D Strain GLS Avg:   -16.1 %               3D Volume EF:             3D EF:    53 %             LV EDV:    131 ml             LV ESV:    62 ml             LV SV:    70 ml   RIGHT VENTRICLE  RV Basal diam: 3.90 cm  RV S prime:   11.80 cm/s  TAPSE (M-mode): 1.8 cm   LEFT ATRIUM       Index    RIGHT ATRIUM      Index  LA diam:    4.30 cm 2.23 cm/m RA Pressure: 3.00 mmHg  LA Vol (A2C):  53.7 ml 27.90 ml/m RA Area:   14.70 cm  LA Vol (A4C):  42.1 ml 21.88 ml/m RA Volume:  34.55 ml 17.95 ml/m  LA Biplane Vol: 47.7 ml 24.79 ml/m  AORTIC VALVE  LVOT Vmax:  87.90 cm/s  LVOT Vmean: 59.500 cm/s  LVOT VTI:  0.197 m    AORTA  Ao Root diam: 3.70 cm  Ao Asc  diam: 3.70 cm   MITRAL VALVE        TRICUSPID VALVE  MV Area (PHT): 3.31 cm   Estimated RAP: 3.00 mmHg  MV Peak grad: 7.3 mmHg  MV Mean grad: 3.0 mmHg   SHUNTS  MV Vmax:    1.35 m/s   Systemic VTI: 0.20 m  MV Vmean:   85.7 cm/s  Systemic Diam: 2.40 cm  MV Decel Time: 229 msec  MV E velocity: 104.00 cm/s  MV A velocity: 115.00 cm/s  MV E/A ratio: 0.90   EKG:  EKG is ordered today.  This demonstrates normal sinus rhythm 62 bpm, first-degree AV block, otherwise within normal limits.  Recent Labs: No results found for requested labs within last 8760 hours.  Recent Lipid Panel No results found for: CHOL, TRIG, HDL, CHOLHDL, VLDL, LDLCALC, LDLDIRECT  Physical Exam:    VS:  BP 126/74   Pulse 62   Ht 5\' 8"  (1.727 m)   Wt 176 lb 3.2 oz (79.9 kg)   SpO2 95%   BMI 26.79 kg/m     Wt Readings from Last 3 Encounters:  07/22/19 176 lb 3.2 oz (79.9 kg)  10/24/18 173 lb 9.6 oz (78.7 kg)  06/12/17 175 lb (79.4 kg)     GEN:  Well nourished, well developed in no acute distress HEENT: Normal NECK: No JVD; No carotid bruits LYMPHATICS: No lymphadenopathy CARDIAC: RRR, no murmurs, rubs, gallops RESPIRATORY:  Clear to auscultation without rales, wheezing or rhonchi  ABDOMEN: Soft, non-tender, non-distended MUSCULOSKELETAL:  No edema; No deformity  SKIN: Warm and dry NEUROLOGIC:  Alert and oriented x 3 PSYCHIATRIC:  Normal affect   ASSESSMENT:    1. Mitral valve disease    PLAN:    In order of problems listed above:  1. The patient is doing very well with New [place] Heart Association functional class I symptoms.  His echo images are personally reviewed and report reviewed as well.  He has improvement in LV function, now with LVEF in the low normal range of 50 to 55%.  His mitral valve repair site is intact with no residual regurgitation and gradients with a mean of 3 mmHg.  I did not recommend any changes today.  We discussed consideration of changing  lisinopril to losartan considering he has nonproductive cough, but he states symptoms are mild and tolerable and he does not wish to change medicines.  I will see him back in 1 year for follow-up evaluation.  His labs are reviewed as above.   Medication Adjustments/Labs and Tests Ordered: Current medicines are reviewed at length with the patient today.  Concerns regarding medicines are outlined above.  Orders Placed This Encounter  Procedures  . EKG 12-Lead   No orders of the defined types were placed in this encounter.   Patient Instructions  Medication Instructions:  Your provider recommends that you continue on your current medications as directed. Please refer to the Current Medication list given to you today.   *If you need a refill on your cardiac medications before your next appointment, please call your pharmacy*   Follow-Up: At [facility], you and your health needs are our priority.  As part of our continuing mission to provide you with exceptional heart care, we have created designated Provider Care Teams.  These Care Teams include your primary Cardiologist (physician) and Advanced Practice Providers (APPs -  Physician Assistants and Nurse Practitioners) who all work together to provide you with the care you need, when you need it. Your next appointment:   12 month(s) The format for your next appointment:   In Person Provider:   You may see Tonny Bollman, MD or one of the following Advanced Practice Providers on your designated Care Team:    Tereso Newcomer, PA-C  Chelsea Aus, [place]      Signed, Tonny Bollman, MD  07/22/2019 12:53 PM    [facility]

## 2019-07-22 NOTE — Patient Instructions (Signed)

## 2019-12-26 ENCOUNTER — Other Ambulatory Visit: Payer: Self-pay | Admitting: Cardiovascular Disease

## 2020-01-08 ENCOUNTER — Ambulatory Visit: Payer: Medicare Other | Attending: Internal Medicine

## 2020-01-08 ENCOUNTER — Ambulatory Visit: Payer: Medicare Other

## 2020-01-08 ENCOUNTER — Other Ambulatory Visit (HOSPITAL_COMMUNITY): Payer: Self-pay | Admitting: Internal Medicine

## 2020-01-08 DIAGNOSIS — Z23 Encounter for immunization: Secondary | ICD-10-CM

## 2020-01-08 NOTE — Progress Notes (Signed)
   Covid-19 Vaccination Clinic  Name:  Jeremy Fisher    MRN: [id number] DOB: 1940/09/28  01/08/2020  [name] was observed post Covid-19 immunization for 15 minutes without incident. He was provided with Vaccine Information Sheet and instruction to access the V-Safe system.   [name] was instructed to call 911 with any severe reactions post vaccine: Marland Kitchen Difficulty breathing  . Swelling of face and throat  . A fast heartbeat  . A bad rash all over body  . Dizziness and weakness

## 2020-02-05 ENCOUNTER — Ambulatory Visit: Payer: Medicare Other

## 2020-08-05 ENCOUNTER — Ambulatory Visit: Payer: Medicare Other | Attending: Internal Medicine

## 2020-08-05 DIAGNOSIS — Z23 Encounter for immunization: Secondary | ICD-10-CM

## 2020-08-05 NOTE — Progress Notes (Signed)
   Covid-19 Vaccination Clinic  Name:  QUAN CYBULSKI    MRN: [id number] DOB: December 06, 1940  08/05/2020  Mr. Mccaskey was observed post Covid-19 immunization for 15 minutes without incident. He was provided with Vaccine Information Sheet and instruction to access the V-Safe system.   Mr. Belcastro was instructed to call 911 with any severe reactions post vaccine: Marland Kitchen Difficulty breathing  . Swelling of face and throat  . A fast heartbeat  . A bad rash all over body  . Dizziness and weakness   Immunizations Administered    Name Date Dose VIS Date Route   PFIZER Comrnaty(Gray TOP) Covid-19 Vaccine 08/05/2020 10:35 AM 0.3 mL 03/12/2020 Intramuscular   Manufacturer: [employer], [employer]   Lot: [id number]   NDC: [id number]

## 2020-08-06 ENCOUNTER — Other Ambulatory Visit (HOSPITAL_COMMUNITY): Payer: Self-pay

## 2020-08-06 MED ORDER — COVID-19 MRNA VAC-TRIS(PFIZER) 30 MCG/0.3ML IM SUSP
INTRAMUSCULAR | 0 refills | Status: DC
  Filled 2020-08-06: qty 0.3, 17d supply, fill #0

## 2021-01-12 ENCOUNTER — Other Ambulatory Visit (HOSPITAL_BASED_OUTPATIENT_CLINIC_OR_DEPARTMENT_OTHER): Payer: Self-pay

## 2021-01-12 ENCOUNTER — Ambulatory Visit: Payer: Medicare Other | Attending: Internal Medicine

## 2021-01-12 DIAGNOSIS — Z23 Encounter for immunization: Secondary | ICD-10-CM

## 2021-01-12 MED ORDER — PFIZER COVID-19 VAC BIVALENT 30 MCG/0.3ML IM SUSP
INTRAMUSCULAR | 0 refills | Status: DC
  Filled 2021-01-12: qty 0.3, 1d supply, fill #0

## 2021-01-12 NOTE — Progress Notes (Signed)
   Covid-19 Vaccination Clinic  Name:  MAYSEN SUDOL    MRN: [id number] DOB: May 31, 1940  01/12/2021  Mr. Doverspike was observed post Covid-19 immunization for 15 minutes without incident. He was provided with Vaccine Information Sheet and instruction to access the V-Safe system.   Mr. Garringer was instructed to call 911 with any severe reactions post vaccine: Difficulty breathing  Swelling of face and throat  A fast heartbeat  A bad rash all over body  Dizziness and weakness

## 2021-01-28 ENCOUNTER — Ambulatory Visit: Payer: Medicare Other

## 2021-02-17 ENCOUNTER — Other Ambulatory Visit: Payer: Self-pay

## 2021-02-17 ENCOUNTER — Ambulatory Visit (INDEPENDENT_AMBULATORY_CARE_PROVIDER_SITE_OTHER): Payer: Medicare Other | Admitting: Cardiovascular Disease

## 2021-02-17 ENCOUNTER — Encounter: Payer: Self-pay | Admitting: Cardiovascular Disease

## 2021-02-17 VITALS — BP 130/70 | HR 65 | Ht 69.0 in | Wt 175.4 lb

## 2021-02-17 DIAGNOSIS — I10 Essential (primary) hypertension: Secondary | ICD-10-CM | POA: Diagnosis not present

## 2021-02-17 DIAGNOSIS — I059 Rheumatic mitral valve disease, unspecified: Secondary | ICD-10-CM | POA: Diagnosis not present

## 2021-02-17 MED ORDER — AMOXICILLIN 500 MG PO CAPS: ORAL_CAPSULE | ORAL | 3 refills | Status: AC

## 2021-02-17 NOTE — Patient Instructions (Signed)
Medication Instructions:  *If you need a refill on your cardiac medications before your next appointment, please call your pharmacy*  Lab Work: If you have labs (blood work) drawn today and your tests are completely normal, you will receive your results only by: MyChart Message (if you have MyChart) OR A paper copy in the mail If you have any lab test that is abnormal or we need to change your treatment, we will call you to review the results.  Follow-Up: At [facility], you and your health needs are our priority.  As part of our continuing mission to provide you with exceptional heart care, we have created designated Provider Care Teams.  These Care Teams include your primary Cardiologist (physician) and Advanced Practice Providers (APPs -  Physician Assistants and Nurse Practitioners) who all work together to provide you with the care you need, when you need it.  We recommend signing up for the patient portal called "MyChart".  Sign up information is provided on this After Visit Summary.  MyChart is used to connect with patients for Virtual Visits (Telemedicine).  Patients are able to view lab/test results, encounter notes, upcoming appointments, etc.  Non-urgent messages can be sent to your provider as well.   To learn more about what you can do with MyChart, go to [email or web address].    Your next appointment:   1 year(s)  The format for your next appointment:   In Person  Provider:   Michael Cooper, MD    

## 2021-02-17 NOTE — Progress Notes (Signed)
Cardiology Office Note:    Date:  02/17/2021   ID:  Jeremy Fisher, DOB April 25, 1940, MRN [id number]  PCP:  Patient, No Pcp Per (Inactive)   [facility] Providers Cardiologist:  Tonny Bollman, MD     Referring MD: No ref. provider found   Chief Complaint  Patient presents with   Mitral Regurgitation    History of Present Illness:    Jeremy Fisher is a 80 y.o. male with a hx of mitral regurgitation status post minimally invasive mitral valve repair and PFO closure in 2018.  The patient had mitral valve prolapse with severe symptomatic mitral regurgitation, prompting his evaluation and ultimate surgical repair of his mitral valve via a minimally invasive approach.  He had an uncomplicated postoperative course and has done well in the interim.  The patient is here alone today.  He has no specific cardiac-related complaints.  He is physically active with no exertional symptoms.  He recently walked up 6 flights of stairs to test his heart and he did well without without any functional limitation.  He denies chest pain, shortness of breath, leg edema, or heart palpitations.  Past Medical History:  Diagnosis Date   Mitral valve prolapse    Patent foramen ovale 07/20/2016   Pulmonary embolism (HCC) 2007   was on Coumadin but has been off for 5 yrs    S/P patent foramen ovale closure 07/27/2016   Severe mitral regurgitation    s/p MV repair // Echo 6/18: Abnormal septal motion, diffuse HK post MV repair, mild LVH, EF 45-50, MV repair with no residual MR and normal diastolic gradients, trivial pericardial effusion   Severe mitral regurgitation 06/27/2016    Past Surgical History:  Procedure Laterality Date   CHOLECYSTECTOMY N/A 06/17/2013   Procedure: LAPAROSCOPIC CHOLECYSTECTOMY WITH INTRAOPERATIVE CHOLANGIOGRAM;  Surgeon: Mariella Saa, MD;  Location: [facility];  Service: General;  Laterality: N/A;   ERCP N/A 05/22/2014   Procedure: ENDOSCOPIC RETROGRADE  CHOLANGIOPANCREATOGRAPHY (ERCP);  Surgeon: Louis Meckel, MD;  Location: [facility] ENDOSCOPY;  Service: Endoscopy;  Laterality: N/A;   EYE SURGERY     right eye   HERNIA REPAIR Left    MITRAL VALVE REPLACEMENT Right 07/27/2016   Procedure: MINIMALLY INVASIVE MITRAL VALVE (MV) REPAIR;  Surgeon: Purcell Nails, MD;  Location: [facility] OR;  Service: Open Heart Surgery;  Laterality: Right;   REPAIR OF PATENT FORAMEN OVALE N/A 07/27/2016   Procedure: REPAIR OF PATENT FORAMEN OVALE;  Surgeon: Purcell Nails, MD;  Location: [facility] OR;  Service: Open Heart Surgery;  Laterality: N/A;   RIGHT/LEFT HEART CATH AND CORONARY ANGIOGRAPHY N/A 07/20/2016   Procedure: Right/Left Heart Cath and Coronary Angiography;  Surgeon: Tonny Bollman, MD;  Location: [facility] INVASIVE CV LAB;  Service: Cardiovascular;  Laterality: N/A;   SPYGLASS CHOLANGIOSCOPY N/A 05/22/2014   Procedure: ZRAQTMAU CHOLANGIOSCOPY;  Surgeon: Louis Meckel, MD;  Location: [facility] ENDOSCOPY;  Service: Endoscopy;  Laterality: N/A;   TEE WITHOUT CARDIOVERSION N/A 07/20/2016   Procedure: TRANSESOPHAGEAL ECHOCARDIOGRAM (TEE);  Surgeon: Laurey Morale, MD;  Location: [facility] ENDOSCOPY;  Service: Cardiovascular;  Laterality: N/A;   TEE WITHOUT CARDIOVERSION N/A 07/27/2016   Procedure: TRANSESOPHAGEAL ECHOCARDIOGRAM (TEE);  Surgeon: Purcell Nails, MD;  Location: [facility] OR;  Service: Open Heart Surgery;  Laterality: N/A;    Current Medications: Current Meds  Medication Sig   amoxicillin (AMOXIL) 500 MG capsule Take 2 grams (4 caps) 1 hour prior to dental procedures   aspirin EC 81 MG EC tablet Take  1 tablet (81 mg total) by mouth daily.   COVID-19 mRNA bivalent vaccine, Pfizer, (PFIZER COVID-19 VAC BIVALENT) injection Inject into the muscle.   COVID-19 mRNA Vac-TriS, Pfizer, SUSP injection Inject into the muscle.   lisinopril (ZESTRIL) 10 MG tablet TAKE ONE TABLET BY MOUTH ONE TIME DAILY     Allergies:   Primaxin [imipenem]   Social History   Socioeconomic History   Marital  status: Married    Spouse name: Not on file   Number of children: Not on file   Years of education: Not on file   Highest education level: Not on file  Occupational History   Not on file  Tobacco Use   Smoking status: Never   Smokeless tobacco: Never  Substance and Sexual Activity   Alcohol use: Yes    Comment: rarely   Drug use: No   Sexual activity: Never  Other Topics Concern   Not on file  Social History Narrative   Not on file   Social Determinants of Health   Financial Resource Strain: Not on file  Food Insecurity: Not on file  Transportation Needs: Not on file  Physical Activity: Not on file  Stress: Not on file  Social Connections: Not on file     Family History: The patient's family history includes Heart disease in his father; Pulmonary fibrosis in his father.  ROS:   Please see the history of present illness.    All other systems reviewed and are negative.  EKGs/Labs/Other Studies Reviewed:    The following studies were reviewed today: Echo 07/22/2019: 1. Normal GLS -16.1. Left ventricular ejection fraction, by estimation,  is 50 to 55%. The left ventricle has low normal function. The left  ventricle has no regional wall motion abnormalities. Left ventricular  diastolic parameters are indeterminate.   2. Right ventricular systolic function is normal. The right ventricular  size is normal.   3. Left atrial size was mildly dilated.   4. Post repair/closure PFO no residual seen.   5. Post repair with reasonable diastolic gradient and no significant  residual MR. The mitral valve has been repaired/replaced. No evidence of  mitral valve regurgitation. No evidence of mitral stenosis. There is a  repaired present in the mitral position.   Procedure Date: 07/27/16.   6. The aortic valve is tricuspid. Aortic valve regurgitation is not  visualized. No aortic stenosis is present.   7. The inferior vena cava is normal in size with greater than 50%  respiratory  variability, suggesting right atrial pressure of 3 mmHg.   Comparison(s): 05/22/17 EF 45-50%.   EKG:  EKG is ordered today.  The ekg ordered today demonstrates normal sinus rhythm 65 bpm, first-degree AV block, otherwise within normal limits  Recent Labs: No results found for requested labs within last 8760 hours.  Recent Lipid Panel No results found for: CHOL, TRIG, HDL, CHOLHDL, VLDL, LDLCALC, LDLDIRECT   Risk Assessment/Calculations:           Physical Exam:    VS:  BP 130/70   Pulse 65   Ht 5\' 9"  (1.753 m)   Wt 175 lb 6.4 oz (79.6 kg)   SpO2 96%   BMI 25.90 kg/m     Wt Readings from Last 3 Encounters:  02/17/21 175 lb 6.4 oz (79.6 kg)  07/22/19 176 lb 3.2 oz (79.9 kg)  10/24/18 173 lb 9.6 oz (78.7 kg)     GEN:  Well nourished, well developed in no acute distress HEENT: Normal NECK:  No JVD; No carotid bruits LYMPHATICS: No lymphadenopathy CARDIAC: RRR, no murmurs, rubs, gallops RESPIRATORY:  Clear to auscultation without rales, wheezing or rhonchi  ABDOMEN: Soft, non-tender, non-distended MUSCULOSKELETAL:  No edema; No deformity  SKIN: Warm and dry NEUROLOGIC:  Alert and oriented x 3 PSYCHIATRIC:  Normal affect   ASSESSMENT:    1. Mitral valve disease   2. Essential hypertension    PLAN:    In order of problems listed above:  The patient's cardiac exam remains completely normal.  His echocardiogram from last year is reviewed and shows normal function of his mitral valve with no residual regurgitation.  I will see him back in clinical follow-up next year.  His amoxicillin prescription is updated and he understands the indication for lifelong SBE prophylaxis.  I will repeat an echo in a few years. Blood pressure borderline today.  Home readings have been averaging about 130/70.  He will continue on lisinopril and metoprolol succinate. The patient's labs are reviewed today.  He has not checked through the [place] system.  His cholesterol is 165, LDL 103, HDL 44,  transaminases are normal with an AST of 19 and an ALT of 21, creatinine is 1.0, potassium is 4.3, TSH is 1.28.           Medication Adjustments/Labs and Tests Ordered: Current medicines are reviewed at length with the patient today.  Concerns regarding medicines are outlined above.  Orders Placed This Encounter  Procedures   EKG 12-Lead   No orders of the defined types were placed in this encounter.   Patient Instructions  Medication Instructions:  *If you need a refill on your cardiac medications before your next appointment, please call your pharmacy*  Lab Work: If you have labs (blood work) drawn today and your tests are completely normal, you will receive your results only by: MyChart Message (if you have MyChart) OR A paper copy in the mail If you have any lab test that is abnormal or we need to change your treatment, we will call you to review the results.  Follow-Up: At [facility], you and your health needs are our priority.  As part of our continuing mission to provide you with exceptional heart care, we have created designated Provider Care Teams.  These Care Teams include your primary Cardiologist (physician) and Advanced Practice Providers (APPs -  Physician Assistants and Nurse Practitioners) who all work together to provide you with the care you need, when you need it.  We recommend signing up for the patient portal called "MyChart".  Sign up information is provided on this After Visit Summary.  MyChart is used to connect with patients for Virtual Visits (Telemedicine).  Patients are able to view lab/test results, encounter notes, upcoming appointments, etc.  Non-urgent messages can be sent to your provider as well.   To learn more about what you can do with MyChart, go to [email or web address].    Your next appointment:   1 year(s)  The format for your next appointment:   In Person  Provider:   Tonny Bollman, MD     Signed, Tonny Bollman, MD   02/17/2021 4:19 PM    [facility]

## 2022-05-26 ENCOUNTER — Ambulatory Visit: Payer: Medicare Other | Attending: Cardiovascular Disease | Admitting: Cardiovascular Disease

## 2022-05-26 ENCOUNTER — Encounter: Payer: Self-pay | Admitting: Cardiovascular Disease

## 2022-05-26 VITALS — BP 126/68 | HR 62 | Resp 94 | Ht 69.0 in | Wt 173.0 lb

## 2022-05-26 DIAGNOSIS — I10 Essential (primary) hypertension: Secondary | ICD-10-CM | POA: Insufficient documentation

## 2022-05-26 DIAGNOSIS — I059 Rheumatic mitral valve disease, unspecified: Secondary | ICD-10-CM | POA: Insufficient documentation

## 2022-05-26 NOTE — Progress Notes (Signed)
Cardiology Office Note:    Date:  05/27/2022   ID:  Jeremy Fisher, DOB 06-26-1940, MRN [id number]  PCP:  Patient, No Pcp Per   [place] Providers Cardiologist:  Sherren Mocha, MD     Referring MD: No ref. provider found   Chief Complaint  Patient presents with   Follow-up    S/P Mitral valve repair    History of Present Illness:    Jeremy Fisher is a 82 y.o. male with a hx of  mitral regurgitation status post minimally invasive mitral valve repair and PFO closure in 2018.  The patient had mitral valve prolapse with severe symptomatic mitral regurgitation, prompting his evaluation and ultimate surgical repair of his mitral valve via a minimally invasive approach.  He had an uncomplicated postoperative course and has done well in the interim. Today, he denies symptoms of palpitations, chest pain, shortness of breath, orthopnea, PND, lower extremity edema, dizziness, or syncope.  The patient remains physically active and frequently works in his shop.  He has no exertional symptoms.  He brings in labs from the [place] system that show an albumin of 4.1, alkaline phosphatase of 76, AST 22, ALT 20, total bilirubin 0.8, creatinine 1.0, glucose 100, sodium 139, potassium 4.5, and calcium 9.8.   Past Medical History:  Diagnosis Date   Mitral valve prolapse    Patent foramen ovale 07/20/2016   Pulmonary embolism ([place]) 2007   was on Coumadin but has been off for 5 yrs    S/P patent foramen ovale closure 07/27/2016   Severe mitral regurgitation    s/p MV repair // Echo 6/18: Abnormal septal motion, diffuse HK post MV repair, mild LVH, EF 45-50, MV repair with no residual MR and normal diastolic gradients, trivial pericardial effusion   Severe mitral regurgitation 06/27/2016    Past Surgical History:  Procedure Laterality Date   CHOLECYSTECTOMY N/A 06/17/2013   Procedure: LAPAROSCOPIC CHOLECYSTECTOMY WITH INTRAOPERATIVE CHOLANGIOGRAM;  Surgeon: Edward Jolly, MD;   Location: [facility];  Service: General;  Laterality: N/A;   ERCP N/A 05/22/2014   Procedure: ENDOSCOPIC RETROGRADE CHOLANGIOPANCREATOGRAPHY (ERCP);  Surgeon: Inda Castle, MD;  Location: [place];  Service: Endoscopy;  Laterality: N/A;   EYE SURGERY     right eye   HERNIA REPAIR Left    MITRAL VALVE REPLACEMENT Right 07/27/2016   Procedure: MINIMALLY INVASIVE MITRAL VALVE (MV) REPAIR;  Surgeon: Rexene Alberts, MD;  Location: [place];  Service: Open Heart Surgery;  Laterality: Right;   REPAIR OF PATENT FORAMEN OVALE N/A 07/27/2016   Procedure: REPAIR OF PATENT FORAMEN OVALE;  Surgeon: Rexene Alberts, MD;  Location: [place];  Service: Open Heart Surgery;  Laterality: N/A;   RIGHT/LEFT HEART CATH AND CORONARY ANGIOGRAPHY N/A 07/20/2016   Procedure: Right/Left Heart Cath and Coronary Angiography;  Surgeon: Sherren Mocha, MD;  Location: [place] CV LAB;  Service: Cardiovascular;  Laterality: N/A;   SPYGLASS CHOLANGIOSCOPY N/A 05/22/2014   Procedure: [id number] CHOLANGIOSCOPY;  Surgeon: Inda Castle, MD;  Location: [place];  Service: Endoscopy;  Laterality: N/A;   TEE WITHOUT CARDIOVERSION N/A 07/20/2016   Procedure: TRANSESOPHAGEAL ECHOCARDIOGRAM (TEE);  Surgeon: Larey Dresser, MD;  Location: [place];  Service: Cardiovascular;  Laterality: N/A;   TEE WITHOUT CARDIOVERSION N/A 07/27/2016   Procedure: TRANSESOPHAGEAL ECHOCARDIOGRAM (TEE);  Surgeon: Rexene Alberts, MD;  Location: [place];  Service: Open Heart Surgery;  Laterality: N/A;    Current Medications: Current Meds  Medication Sig   amoxicillin (  AMOXIL) 500 MG capsule Take 2 grams (4 caps) 1 hour prior to dental procedures   aspirin EC 81 MG EC tablet Take 1 tablet (81 mg total) by mouth daily.   lisinopril (ZESTRIL) 10 MG tablet TAKE ONE TABLET BY MOUTH ONE TIME DAILY     Allergies:   Primaxin [imipenem]   Social History   Socioeconomic History   Marital status: Married    Spouse name: Not on file   Number of children:  Not on file   Years of education: Not on file   Highest education level: Not on file  Occupational History   Not on file  Tobacco Use   Smoking status: Never   Smokeless tobacco: Never  Substance and Sexual Activity   Alcohol use: Yes    Comment: rarely   Drug use: No   Sexual activity: Never  Other Topics Concern   Not on file  Social History Narrative   Not on file   Social Determinants of Health   Financial Resource Strain: Not on file  Food Insecurity: Not on file  Transportation Needs: Not on file  Physical Activity: Not on file  Stress: Not on file  Social Connections: Not on file     Family History: The patient's family history includes Heart disease in his father; Pulmonary fibrosis in his father.  ROS:   Please see the history of present illness.    All other systems reviewed and are negative.  EKGs/Labs/Other Studies Reviewed:    The following studies were reviewed today: Echo 07/22/2019: 1. Normal GLS -16.1. Left ventricular ejection fraction, by estimation,  is 50 to 55%. The left ventricle has low normal function. The left  ventricle has no regional wall motion abnormalities. Left ventricular  diastolic parameters are indeterminate.   2. Right ventricular systolic function is normal. The right ventricular  size is normal.   3. Left atrial size was mildly dilated.   4. Post repair/closure PFO no residual seen.   5. Post repair with reasonable diastolic gradient and no significant  residual MR. The mitral valve has been repaired/replaced. No evidence of  mitral valve regurgitation. No evidence of mitral stenosis. There is a  repaired present in the mitral position.   Procedure Date: 07/27/16.   6. The aortic valve is tricuspid. Aortic valve regurgitation is not  visualized. No aortic stenosis is present.   7. The inferior vena cava is normal in size with greater than 50%  respiratory variability, suggesting right atrial pressure of 3 mmHg.    Comparison(s): 05/22/17 EF 45-50%.    EKG:  EKG is ordered today.  The ekg ordered today demonstrates NSR 61 bpm with first degree AV block  Recent Labs: No results found for requested labs within last 365 days.  Recent Lipid Panel No results found for: "CHOL", "TRIG", "HDL", "CHOLHDL", "VLDL", "LDLCALC", "LDLDIRECT"   Risk Assessment/Calculations:                Physical Exam:    VS:  BP 126/68 (BP Location: Left Arm, Patient Position: Sitting, Cuff Size: Normal)   Pulse 62   Resp (!) 94   Ht '5\' 9"'$  (1.753 m)   Wt 173 lb (78.5 kg)   BMI 25.55 kg/m     Wt Readings from Last 3 Encounters:  05/26/22 173 lb (78.5 kg)  02/17/21 175 lb 6.4 oz (79.6 kg)  07/22/19 176 lb 3.2 oz (79.9 kg)     GEN:  Well nourished, well developed in no  acute distress HEENT: Normal NECK: No JVD; No carotid bruits LYMPHATICS: No lymphadenopathy CARDIAC: RRR, no murmurs, rubs, gallops RESPIRATORY:  Clear to auscultation without rales, wheezing or rhonchi  ABDOMEN: Soft, non-tender, non-distended MUSCULOSKELETAL:  No edema; No deformity  SKIN: Warm and dry NEUROLOGIC:  Alert and oriented x 3 PSYCHIATRIC:  Normal affect   ASSESSMENT:    1. Mitral valve disease   2. Essential hypertension    PLAN:    In order of problems listed above:  Patient continues to do well with NYHA functional class I symptoms.  His exam suggests normal function of his mitral valve repair site.  His last echo from 2021 is reviewed.  I recommend that we repeat an echocardiogram next year prior to his office visit.  He continues on aspirin 81 mg, lisinopril, and metoprolol succinate.  He follows SBE prophylaxis with amoxicillin when indicated. Blood pressure is well-controlled.  He will continue on lisinopril and metoprolol succinate.  He notes a nagging dry cough, but thinks it is likely coming from gastroesophageal reflux.  We discussed changing from lisinopril to losartan, but he would like to continue with his  current medical regimen.     Medication Adjustments/Labs and Tests Ordered: Current medicines are reviewed at length with the patient today.  Concerns regarding medicines are outlined above.  Orders Placed This Encounter  Procedures   ECHOCARDIOGRAM COMPLETE   No orders of the defined types were placed in this encounter.   Patient Instructions  Medication Instructions:  Your physician recommends that you continue on your current medications as directed. Please refer to the Current Medication list given to you today.  *If you need a refill on your cardiac medications before your next appointment, please call your pharmacy*   Lab Work: NONE If you have labs (blood work) drawn today and your tests are completely normal, you will receive your results only by: [place] (if you have MyChart) OR A paper copy in the mail If you have any lab test that is abnormal or we need to change your treatment, we will call you to review the results.   Testing/Procedures: ECHO (to be done in 1 year, prior to visit) Your physician has requested that you have an echocardiogram. Echocardiography is a painless test that uses sound waves to create images of your heart. It provides your doctor with information about the size and shape of your heart and how well your heart's chambers and valves are working. This procedure takes approximately one hour. There are no restrictions for this procedure. Please do NOT wear cologne, perfume, aftershave, or lotions (deodorant is allowed). Please arrive 15 minutes prior to your appointment time.    Follow-Up: At [facility], you and your health needs are our priority.  As part of our continuing mission to provide you with exceptional heart care, we have created designated Provider Care Teams.  These Care Teams include your primary Cardiologist (physician) and Advanced Practice Providers (APPs -  Physician Assistants and Nurse Practitioners) who all  work together to provide you with the care you need, when you need it.  We recommend signing up for the patient portal called "MyChart".  Sign up information is provided on this After Visit Summary.  MyChart is used to connect with patients for Virtual Visits (Telemedicine).  Patients are able to view lab/test results, encounter notes, upcoming appointments, etc.  Non-urgent messages can be sent to your provider as well.   To learn more about what you can do  with MyChart, go to [email or web address].    Your next appointment:   1 year(s)  Provider:   Sherren Mocha, MD        Signed, Sherren Mocha, MD  05/27/2022 8:34 AM    [place]

## 2022-05-26 NOTE — Patient Instructions (Signed)
Medication Instructions:  Your physician recommends that you continue on your current medications as directed. Please refer to the Current Medication list given to you today.  *If you need a refill on your cardiac medications before your next appointment, please call your pharmacy*   Lab Work: NONE If you have labs (blood work) drawn today and your tests are completely normal, you will receive your results only by: [place] (if you have MyChart) OR A paper copy in the mail If you have any lab test that is abnormal or we need to change your treatment, we will call you to review the results.   Testing/Procedures: ECHO (to be done in 1 year, prior to visit) Your physician has requested that you have an echocardiogram. Echocardiography is a painless test that uses sound waves to create images of your heart. It provides your doctor with information about the size and shape of your heart and how well your heart's chambers and valves are working. This procedure takes approximately one hour. There are no restrictions for this procedure. Please do NOT wear cologne, perfume, aftershave, or lotions (deodorant is allowed). Please arrive 15 minutes prior to your appointment time.    Follow-Up: At [facility], you and your health needs are our priority.  As part of our continuing mission to provide you with exceptional heart care, we have created designated Provider Care Teams.  These Care Teams include your primary Cardiologist (physician) and Advanced Practice Providers (APPs -  Physician Assistants and Nurse Practitioners) who all work together to provide you with the care you need, when you need it.  We recommend signing up for the patient portal called "MyChart".  Sign up information is provided on this After Visit Summary.  MyChart is used to connect with patients for Virtual Visits (Telemedicine).  Patients are able to view lab/test results, encounter notes, upcoming appointments,  etc.  Non-urgent messages can be sent to your provider as well.   To learn more about what you can do with MyChart, go to [email or web address].    Your next appointment:   1 year(s)  Provider:   Sherren Mocha, MD

## 2022-05-27 ENCOUNTER — Encounter: Payer: Self-pay | Admitting: Cardiovascular Disease

## 2022-05-27 NOTE — Addendum Note (Signed)
Addended by: Ma Hillock on: 05/27/2022 09:48 AM   Modules accepted: Orders

## 2023-01-19 ENCOUNTER — Telehealth: Payer: Self-pay | Admitting: Cardiovascular Disease

## 2023-01-19 NOTE — Telephone Encounter (Signed)
Returned call to patient who was visiting friends in [place], [place] and had an episode of aphasia that lasted reportedly 45 minutes. Went to ED there and CT was negative for acute stroke, MRI revealed TIA with history of 2 remote strokes as well. He was unaware of these entirely. He was discharged today, but told to follow-up with cardiology and neurology. Pt requesting to see Excell Seltzer and will discuss plan of care. Appt scheduled for 01/23/23.

## 2023-01-19 NOTE — Telephone Encounter (Signed)
New Message:      Patient says he needs to talk to Dr Earmon Phoenix  nurse asap please. He is in the hospital in [place], [place]. He needs to talk to the nurse about getting his Echo here instead of at the hospital there. He said that they said that he might be discharged this afternoon.Marland Kitchen

## 2023-01-23 ENCOUNTER — Ambulatory Visit: Payer: Medicare Other | Attending: Cardiovascular Disease | Admitting: Cardiovascular Disease

## 2023-01-23 ENCOUNTER — Other Ambulatory Visit: Payer: Self-pay | Admitting: Cardiovascular Disease

## 2023-01-23 ENCOUNTER — Encounter: Payer: Self-pay | Admitting: Cardiovascular Disease

## 2023-01-23 ENCOUNTER — Encounter: Payer: Self-pay | Admitting: *Deleted

## 2023-01-23 VITALS — BP 118/68 | HR 64 | Ht 69.0 in | Wt 164.8 lb

## 2023-01-23 DIAGNOSIS — I059 Rheumatic mitral valve disease, unspecified: Secondary | ICD-10-CM | POA: Insufficient documentation

## 2023-01-23 DIAGNOSIS — I440 Atrioventricular block, first degree: Secondary | ICD-10-CM

## 2023-01-23 DIAGNOSIS — I10 Essential (primary) hypertension: Secondary | ICD-10-CM | POA: Diagnosis present

## 2023-01-23 DIAGNOSIS — G459 Transient cerebral ischemic attack, unspecified: Secondary | ICD-10-CM | POA: Insufficient documentation

## 2023-01-23 NOTE — Patient Instructions (Signed)
Testing/Procedures: ECHO Your physician has requested that you have an echocardiogram. Echocardiography is a painless test that uses sound waves to create images of your heart. It provides your doctor with information about the size and shape of your heart and how well your heart's chambers and valves are working. This procedure takes approximately one hour. There are no restrictions for this procedure. Please do NOT wear cologne, perfume, aftershave, or lotions (deodorant is allowed). Please arrive 15 minutes prior to your appointment time.  30-day event monitor Your physician has recommended that you wear an event monitor. Event monitors are medical devices that record the heart's electrical activity. Doctors most often [place] these monitors to diagnose arrhythmias. Arrhythmias are problems with the speed or rhythm of the heartbeat. The monitor is a small, portable device. You can wear one while you do your normal daily activities. This is usually used to diagnose what is causing palpitations/syncope (passing out).  Follow-Up: At [facility], you and your health needs are our priority.  As part of our continuing mission to provide you with exceptional heart care, we have created designated Provider Care Teams.  These Care Teams include your primary Cardiologist (physician) and Advanced Practice Providers (APPs -  Physician Assistants and Nurse Practitioners) who all work together to provide you with the care you need, when you need it.  Your next appointment:   1 year(s)  Provider:   Tonny Bollman, MD

## 2023-01-23 NOTE — Progress Notes (Signed)
Cardiology Office Note:    Date:  01/23/2023   ID:  Jeremy Fisher, DOB [birth year], MRN [id number]  PCP:  Patient, No Pcp Per   [facility] Providers Cardiologist:  Tonny Bollman, MD     Referring MD: No ref. provider found   Chief Complaint  Patient presents with   Transient Ischemic Attack    History of Present Illness:    Jeremy Fisher is a 82 y.o. male presenting for hospital follow-up evaluation after he was hospitalized with a TIA.  He presented with left facial droop, dysarthria, and right hand weakness.  Symptoms improved spontaneously after arrival to the emergency department and he did not require thrombolytic therapy.  Reportedly an MRI of the brain showed a cortical infarct along the left precentral gyrus.  There were remote parietal infarcts noted and an old right cerebellar lacunar infarct.  He was found to have no large vessel occlusive disease in the head and neck.  The patient has a history of mitral regurgitation and underwent minimally invasive mitral valve repair and PFO closure in 2018.  I have followed him since that time and he has done well with medical therapy.  He states that at the time of his TIA, he could not speak for about 45 minutes.  They were close to giving him tPA, but right at that time his speech started coming back.  He still has a funny feeling in the fingers of his right hand but overall has had a very good recovery.  He denies chest pain, heart palpitations, or shortness of breath.  States that preceding his TIA symptoms, he had a vigorous coughing spell.   Current Medications: Current Meds  Medication Sig   amoxicillin (AMOXIL) 500 MG capsule Take 2 grams (4 caps) 1 hour prior to dental procedures   aspirin EC 81 MG EC tablet Take 1 tablet (81 mg total) by mouth daily.   atorvastatin (LIPITOR) 40 MG tablet Take 1 tablet by mouth at bedtime.   clopidogrel (PLAVIX) 75 MG tablet Take 1 tablet by mouth daily.   lisinopril  (ZESTRIL) 10 MG tablet TAKE ONE TABLET BY MOUTH ONE TIME DAILY   metoprolol succinate (TOPROL-XL) 25 MG 24 hr tablet Take 1 tablet (25 mg total) by mouth daily.     Allergies:   Primaxin [imipenem]   ROS:   Please see the history of present illness.    All other systems reviewed and are negative.  EKGs/Labs/Other Studies Reviewed:    The following studies were reviewed today: Cardiac Studies & Procedures   CARDIAC CATHETERIZATION  CARDIAC CATHETERIZATION 07/20/2016  Narrative 1. Patent coronary arteries with minimal nonobstructive CAD, intramyocardial segment of mid-LAD noted 2. Normal LVEDP and PA pressure 3. Large V waves c/w severe mitral regurgitation  Recommend: Continued evaluation for mitral valve repair per Dr [name]  Findings Coronary Findings Diagnostic  Dominance: Right  Left Anterior Descending  intramyocardial bridging  Ramus Intermedius Vessel is angiographically normal.  Left Circumflex The vessel exhibits minimal luminal irregularities.  Right Coronary Artery Vessel is angiographically normal.  Intervention  No interventions have been documented.     ECHOCARDIOGRAM  ECHOCARDIOGRAM COMPLETE 07/22/2019  Narrative ECHOCARDIOGRAM REPORT    Patient Name:   Jeremy Fisher [facility] Date of Exam: 07/22/2019 Medical Rec #:  [id number]           Height:       68.0 in Accession #:    [id number]  Weight:       173.6 lb Date of Birth:  1940/06/15            BSA:          1.924 m Patient Age:    79 years            BP:           110/78 mmHg Patient Gender: M                   HR:           67 bpm. Exam Location:  [address]  Procedure: 3D Echo, 2D Echo, Cardiac Doppler, Color Doppler and Strain Analysis  Indications:    I05.9 Mitral valve disease  History:        Patient has prior history of Echocardiogram examinations, most recent 05/22/2017. Mitral Valve Prolapse. PFO s/p closure 07/27/16. Pulmonary embolus.  Mitral Valve: repaired valve is  present in the mitral position. Procedure Date: 07/27/16.  Sonographer:    Garald Braver, RDCS Referring Phys: [phone] Jeremy Fisher  IMPRESSIONS   1. Normal GLS -16.1. Left ventricular ejection fraction, by estimation, is 50 to 55%. The left ventricle has low normal function. The left ventricle has no regional wall motion abnormalities. Left ventricular diastolic parameters are indeterminate. 2. Right ventricular systolic function is normal. The right ventricular size is normal. 3. Left atrial size was mildly dilated. 4. Post repair/closure PFO no residual seen. 5. Post repair with reasonable diastolic gradient and no significant residual MR. The mitral valve has been repaired/replaced. No evidence of mitral valve regurgitation. No evidence of mitral stenosis. There is a repaired present in the mitral position. Procedure Date: 07/27/16. 6. The aortic valve is tricuspid. Aortic valve regurgitation is not visualized. No aortic stenosis is present. 7. The inferior vena cava is normal in size with greater than 50% respiratory variability, suggesting right atrial pressure of 3 mmHg.  Comparison(s): 05/22/17 EF 45-50%.  FINDINGS Left Ventricle: Normal GLS -16.1. Left ventricular ejection fraction, by estimation, is 50 to 55%. The left ventricle has low normal function. The left ventricle has no regional wall motion abnormalities. The left ventricular internal cavity size was normal in size. There is no left ventricular hypertrophy. Left ventricular diastolic parameters are indeterminate.  Right Ventricle: The right ventricular size is normal. No increase in right ventricular wall thickness. Right ventricular systolic function is normal.  Left Atrium: Left atrial size was mildly dilated.  Right Atrium: Right atrial size was normal in size.  Pericardium: There is no evidence of pericardial effusion.  Mitral Valve: Post repair with reasonable diastolic gradient and no significant residual MR. The  mitral valve has been repaired/replaced. Normal mobility of the mitral valve leaflets. No evidence of mitral valve regurgitation. There is a repaired present in the mitral position. Procedure Date: 07/27/16. No evidence of mitral valve stenosis. MV peak gradient, 7.3 mmHg. The mean mitral valve gradient is 3.0 mmHg.  Tricuspid Valve: The tricuspid valve is normal in structure. Tricuspid valve regurgitation is mild . No evidence of tricuspid stenosis.  Aortic Valve: The aortic valve is tricuspid. Aortic valve regurgitation is not visualized. No aortic stenosis is present.  Pulmonic Valve: The pulmonic valve was normal in structure. Pulmonic valve regurgitation is not visualized. No evidence of pulmonic stenosis.  Aorta: The aortic root is normal in size and structure.  Venous: The inferior vena cava is normal in size with greater than 50% respiratory variability, suggesting right atrial pressure  of 3 mmHg.  IAS/Shunts: No atrial level shunt detected by color flow Doppler.   LEFT VENTRICLE PLAX 2D LVIDd:         4.90 cm  Diastology LVIDs:         3.40 cm  LV e' lateral:   3.60 cm/s LV PW:         0.90 cm  LV E/e' lateral: 28.9 LV IVS:        1.10 cm  LV e' medial:    3.38 cm/s LVOT diam:     2.40 cm  LV E/e' medial:  30.8 LV SV:         89 LV SV Index:   46       2D Longitudinal Strain LVOT Area:     4.52 cm 2D Strain GLS (A2C):   -16.3 % 2D Strain GLS (A3C):   -15.8 % 2D Strain GLS (A4C):   -16.2 % 2D Strain GLS Avg:     -16.1 %  3D Volume EF: 3D EF:        53 % LV EDV:       131 ml LV ESV:       62 ml LV SV:        70 ml  RIGHT VENTRICLE RV Basal diam:  3.90 cm RV S prime:     11.80 cm/s TAPSE (M-mode): 1.8 cm  LEFT ATRIUM             Index       RIGHT ATRIUM           Index LA diam:        4.30 cm 2.23 cm/m  RA Pressure: 3.00 mmHg LA Vol (A2C):   53.7 ml 27.90 ml/m RA Area:     14.70 cm LA Vol (A4C):   42.1 ml 21.88 ml/m RA Volume:   34.55 ml  17.95 ml/m LA Biplane  Vol: 47.7 ml 24.79 ml/m AORTIC VALVE LVOT Vmax:   87.90 cm/s LVOT Vmean:  59.500 cm/s LVOT VTI:    0.197 m  AORTA Ao Root diam: 3.70 cm Ao Asc diam:  3.70 cm  MITRAL VALVE                TRICUSPID VALVE MV Area (PHT): 3.31 cm     Estimated RAP:  3.00 mmHg MV Peak grad:  7.3 mmHg MV Mean grad:  3.0 mmHg     SHUNTS MV Vmax:       1.35 m/s     Systemic VTI:  0.20 m MV Vmean:      85.7 cm/s    Systemic Diam: 2.40 cm MV Decel Time: 229 msec MV E velocity: 104.00 cm/s MV A velocity: 115.00 cm/s MV E/A ratio:  0.90  Charlton Haws MD Electronically signed by Charlton Haws MD Signature Date/Time: 07/22/2019/10:22:33 AM    Final   TEE  ECHO TEE 07/27/2016  Interpretation Summary  Right ventricle: The right ventricular cavity was of normal size. There was normal right ventricular systolic function.  Tricuspid valve: The tricuspid valve appeared structurally normal there was trace tricuspid insufficiency.  Left ventricle: The left ventricular cavity was enlarged and measured 6.0 cm at end diastole at the mid-papillary level in the transgastric short axis view. The end-systolic diameter was 3.6 cm. The ejection fraction was estimated 60-65%. There were no regional wall motion abnormalities. Left ventricular wall thickness measured 0.95-1.1 cm concentrically. Post-bypass findings: The left ventricular cavity was reduced in size and measured 5.3  cm at end diastole at the mid-papillary level in the  Pulmonic valve: There was 1+ pulmonic insufficiency.  Mitral valve: Was moderate to severe mitral regurgitation. This was due to a flail posterior leaflet involving the P2 segment. There was an anteriorly directed jet of mitral regurgitation which tract along the superior surface of the anterior leaflet and along the anterior wall of the left atrium. The regurgitant volume was estimated at 50 mL. This was calculated by subtracting the stroke-volume using the Simpsons method of disks in the 4  chamber and 2 chamber views from the stroke-volume using            EKG:        Recent Labs: No results found for requested labs within last 365 days.  Recent Lipid Panel No results found for: "CHOL", "TRIG", "HDL", "CHOLHDL", "VLDL", "LDLCALC", "LDLDIRECT"   Risk Assessment/Calculations:                Physical Exam:    VS:  BP 118/68   Pulse 64   Ht 5\' 9"  (1.753 m)   Wt 164 lb 12.8 oz (74.8 kg)   SpO2 94%   BMI 24.34 kg/m     Wt Readings from Last 3 Encounters:  01/23/23 164 lb 12.8 oz (74.8 kg)  05/26/22 173 lb (78.5 kg)  02/17/21 175 lb 6.4 oz (79.6 kg)     GEN:  Well nourished, well developed in no acute distress HEENT: Normal NECK: No JVD; No carotid bruits LYMPHATICS: No lymphadenopathy CARDIAC: RRR, no murmurs, rubs, gallops RESPIRATORY:  Clear to auscultation without rales, wheezing or rhonchi  ABDOMEN: Soft, non-tender, non-distended MUSCULOSKELETAL:  No edema; No deformity  SKIN: Warm and dry NEUROLOGIC:  Alert and oriented x 3 PSYCHIATRIC:  Normal affect   Assessment & Plan TIA (transient ischemic attack) Hospital data reviewed.  No large vessel occlusive disease.  Agree patient should have further cardiac evaluation with an echocardiogram and 30-day event monitor to evaluate for atrial fibrillation as a potential source of embolic stroke. Mitral valve disease Check 2D echo, continue SBE prophylaxis when indicated Essential hypertension Appears to be doing very well on lisinopril and metoprolol succinate.  Offered to change him from lisinopril to an ARB because of his cough, but he declines.  States that it is very minor and does not bother him.  He prefers to stay on lisinopril.            Medication Adjustments/Labs and Tests Ordered: Current medicines are reviewed at length with the patient today.  Concerns regarding medicines are outlined above.  Orders Placed This Encounter  Procedures   CARDIAC EVENT MONITOR   ECHOCARDIOGRAM  COMPLETE   No orders of the defined types were placed in this encounter.   Patient Instructions  Testing/Procedures: ECHO Your physician has requested that you have an echocardiogram. Echocardiography is a painless test that uses sound waves to create images of your heart. It provides your doctor with information about the size and shape of your heart and how well your heart's chambers and valves are working. This procedure takes approximately one hour. There are no restrictions for this procedure. Please do NOT wear cologne, perfume, aftershave, or lotions (deodorant is allowed). Please arrive 15 minutes prior to your appointment time.  30-day event monitor Your physician has recommended that you wear an event monitor. Event monitors are medical devices that record the heart's electrical activity. Doctors most often [place] these monitors to diagnose arrhythmias. Arrhythmias are problems with the  speed or rhythm of the heartbeat. The monitor is a small, portable device. You can wear one while you do your normal daily activities. This is usually used to diagnose what is causing palpitations/syncope (passing out).  Follow-Up: At [facility], you and your health needs are our priority.  As part of our continuing mission to provide you with exceptional heart care, we have created designated Provider Care Teams.  These Care Teams include your primary Cardiologist (physician) and Advanced Practice Providers (APPs -  Physician Assistants and Nurse Practitioners) who all work together to provide you with the care you need, when you need it.  Your next appointment:   1 year(s)  Provider:   Tonny Bollman, MD        Signed, Tonny Bollman, MD  01/23/2023 2:13 PM    [facility]

## 2023-01-23 NOTE — Progress Notes (Signed)
Patient enrolled for [employer] to ship a 30 day cardiac event monitor to his address on file.

## 2023-02-01 ENCOUNTER — Ambulatory Visit (HOSPITAL_COMMUNITY): Payer: Medicare Other | Attending: Cardiology

## 2023-02-01 DIAGNOSIS — G459 Transient cerebral ischemic attack, unspecified: Secondary | ICD-10-CM | POA: Insufficient documentation

## 2023-02-01 DIAGNOSIS — I10 Essential (primary) hypertension: Secondary | ICD-10-CM | POA: Diagnosis present

## 2023-02-01 DIAGNOSIS — I059 Rheumatic mitral valve disease, unspecified: Secondary | ICD-10-CM | POA: Diagnosis present

## 2023-02-01 DIAGNOSIS — I440 Atrioventricular block, first degree: Secondary | ICD-10-CM

## 2023-02-01 LAB — ECHOCARDIOGRAM COMPLETE
Area-P 1/2: 2.83 cm2
MV VTI: 1.65 cm2
S' Lateral: 2.9 cm

## 2023-03-08 ENCOUNTER — Ambulatory Visit: Payer: Medicare Other | Attending: Cardiovascular Disease

## 2023-03-08 DIAGNOSIS — I059 Rheumatic mitral valve disease, unspecified: Secondary | ICD-10-CM

## 2023-03-08 DIAGNOSIS — I440 Atrioventricular block, first degree: Secondary | ICD-10-CM

## 2023-03-08 DIAGNOSIS — G459 Transient cerebral ischemic attack, unspecified: Secondary | ICD-10-CM

## 2023-03-14 ENCOUNTER — Encounter: Payer: Self-pay | Admitting: Cardiovascular Disease

## 2023-03-17 NOTE — Telephone Encounter (Signed)
Left detailed message per DPR of results as well since pt has not viewed Dr Earmon Phoenix comments yet. Advised he call or message back if needed.
